# Patient Record
Sex: Male | Born: 1975 | Race: White | Hispanic: No | Marital: Married | Smoking: Current every day smoker
Health system: Southern US, Community
[De-identification: ages and names within clinical notes are randomized; demographics above are authoritative.]

## PROBLEM LIST (undated history)

## (undated) DIAGNOSIS — F909 Attention-deficit hyperactivity disorder, unspecified type: Secondary | ICD-10-CM

## (undated) DIAGNOSIS — E785 Hyperlipidemia, unspecified: Secondary | ICD-10-CM

## (undated) DIAGNOSIS — H409 Unspecified glaucoma: Secondary | ICD-10-CM

## (undated) DIAGNOSIS — R011 Cardiac murmur, unspecified: Secondary | ICD-10-CM

## (undated) HISTORY — DX: Hyperlipidemia, unspecified: E78.5

## (undated) HISTORY — PX: CYST REMOVAL HAND: SHX6279

## (undated) HISTORY — DX: Cardiac murmur, unspecified: R01.1

## (undated) HISTORY — PX: OTHER SURGICAL HISTORY: SHX169

## (undated) HISTORY — DX: Unspecified glaucoma: H40.9

## (undated) HISTORY — DX: Attention-deficit hyperactivity disorder, unspecified type: F90.9

---

## 2005-11-21 ENCOUNTER — Ambulatory Visit (HOSPITAL_COMMUNITY): Payer: Self-pay | Admitting: *Deleted

## 2005-12-12 ENCOUNTER — Ambulatory Visit (HOSPITAL_COMMUNITY): Payer: Self-pay | Admitting: Psychiatry

## 2006-01-30 ENCOUNTER — Ambulatory Visit (HOSPITAL_COMMUNITY): Payer: Self-pay | Admitting: Psychiatry

## 2006-05-20 ENCOUNTER — Ambulatory Visit (HOSPITAL_COMMUNITY): Payer: Self-pay | Admitting: Psychiatry

## 2006-08-14 ENCOUNTER — Ambulatory Visit (HOSPITAL_COMMUNITY): Payer: Self-pay | Admitting: Psychiatry

## 2007-05-13 ENCOUNTER — Ambulatory Visit (HOSPITAL_COMMUNITY): Payer: Self-pay | Admitting: Psychiatry

## 2007-06-15 ENCOUNTER — Ambulatory Visit (HOSPITAL_COMMUNITY): Payer: Self-pay | Admitting: Psychiatry

## 2007-08-16 ENCOUNTER — Ambulatory Visit (HOSPITAL_COMMUNITY): Payer: Self-pay | Admitting: Psychiatry

## 2007-10-29 ENCOUNTER — Ambulatory Visit (HOSPITAL_COMMUNITY): Payer: Self-pay | Admitting: Psychiatry

## 2008-01-28 ENCOUNTER — Ambulatory Visit (HOSPITAL_COMMUNITY): Payer: Self-pay | Admitting: Psychiatry

## 2008-04-07 ENCOUNTER — Ambulatory Visit (HOSPITAL_COMMUNITY): Payer: Self-pay | Admitting: Psychiatry

## 2008-07-14 ENCOUNTER — Ambulatory Visit (HOSPITAL_COMMUNITY): Payer: Self-pay | Admitting: Psychiatry

## 2008-10-13 ENCOUNTER — Ambulatory Visit (HOSPITAL_COMMUNITY): Payer: Self-pay | Admitting: Psychiatry

## 2009-01-12 ENCOUNTER — Ambulatory Visit (HOSPITAL_COMMUNITY): Payer: Self-pay | Admitting: Psychiatry

## 2009-04-13 ENCOUNTER — Ambulatory Visit (HOSPITAL_COMMUNITY): Payer: Self-pay | Admitting: Psychiatry

## 2009-12-07 ENCOUNTER — Ambulatory Visit (HOSPITAL_COMMUNITY): Payer: Self-pay | Admitting: Psychiatry

## 2010-05-31 ENCOUNTER — Ambulatory Visit (HOSPITAL_COMMUNITY): Payer: Self-pay | Admitting: Psychiatry

## 2010-11-14 ENCOUNTER — Encounter (INDEPENDENT_AMBULATORY_CARE_PROVIDER_SITE_OTHER): Payer: BC Managed Care – PPO | Admitting: Psychiatry

## 2010-11-14 DIAGNOSIS — F909 Attention-deficit hyperactivity disorder, unspecified type: Secondary | ICD-10-CM

## 2010-11-15 ENCOUNTER — Encounter (HOSPITAL_COMMUNITY): Payer: Self-pay | Admitting: Psychiatry

## 2011-05-16 ENCOUNTER — Encounter (INDEPENDENT_AMBULATORY_CARE_PROVIDER_SITE_OTHER): Payer: BC Managed Care – PPO | Admitting: Psychiatry

## 2011-05-16 DIAGNOSIS — F988 Other specified behavioral and emotional disorders with onset usually occurring in childhood and adolescence: Secondary | ICD-10-CM

## 2011-10-28 ENCOUNTER — Encounter (HOSPITAL_COMMUNITY): Payer: Self-pay

## 2011-10-31 ENCOUNTER — Ambulatory Visit (INDEPENDENT_AMBULATORY_CARE_PROVIDER_SITE_OTHER): Payer: BC Managed Care – PPO | Admitting: Psychiatry

## 2011-10-31 ENCOUNTER — Encounter (HOSPITAL_COMMUNITY): Payer: Self-pay | Admitting: Psychiatry

## 2011-10-31 VITALS — BP 126/82 | Ht 70.0 in | Wt 197.0 lb

## 2011-10-31 DIAGNOSIS — F909 Attention-deficit hyperactivity disorder, unspecified type: Secondary | ICD-10-CM | POA: Insufficient documentation

## 2011-10-31 MED ORDER — DEXMETHYLPHENIDATE HCL ER 20 MG PO CP24: 40.0000 mg | ORAL_CAPSULE | Freq: Every day | ORAL | Status: DC

## 2011-10-31 NOTE — Progress Notes (Signed)
   [facility] Behavioral Health Follow-up Outpatient Visit  ADEYEMI HAMAD Feb 23, 1976   Subjective: The patient is a 36 year old male who has been followed by [facility] since April of 2007. I have been treating him since August of 2008. The patient is currently diagnosed with ADHD along with a mood disorder. His only medication is Focalin XR 20 mg once in the morning. The patient presents today for a six-month followup. He is doing pretty well. One of his coworkers is retiring and he may be able to take over the position. This would involve daytime hours and the Monday through Friday job. He would no longer have to work on Saturdays, or have the abnormal scheduled that he currently has. He reports that his sweating at night is doing better. He has been pushing fluids. He still does continue to smoke. The patient states that he sometimes cannot tell if he takes his medicine. He doesn't see a lot of difference. He will occasionally missed it on weekends, because his schedule is off. There are no more issues with her neighbors. He endorses good sleep and appetite. He has no other complaints. He feels that his mood has been stable.  Filed Vitals:   10/31/11 1125  BP: 126/82    Mental Status Examination  Appearance: Casually dressed Alert: Yes Attention: good  Cooperative: Yes Eye Contact: Good Speech: Regular rate rhythm and volume Psychomotor Activity: Normal Memory/Concentration: Intact Oriented: person, place, time/date and situation Mood: Euthymic Affect: Congruent Thought Processes and Associations: Logical Fund of Knowledge: Fair Thought Content: No suicidal or homicidal thoughts Insight: Fair Judgement: Fair  Diagnosis: ADHD inattentive type, mood disorder NOS  Treatment Plan: We'll increase his Focalin XR to 40 mg daily. Patient may try to take the 2 pills approximately 6 hours apart for daytime coverage. I will see him back in 3 months. I provided him with a 30  day supply along with a 90 day supply for mail in.  Jamse Mead, MD

## 2011-11-14 ENCOUNTER — Encounter (HOSPITAL_COMMUNITY): Payer: BC Managed Care – PPO | Admitting: Psychiatry

## 2012-01-30 ENCOUNTER — Ambulatory Visit (INDEPENDENT_AMBULATORY_CARE_PROVIDER_SITE_OTHER): Payer: BC Managed Care – PPO | Admitting: Psychiatry

## 2012-01-30 ENCOUNTER — Encounter (HOSPITAL_COMMUNITY): Payer: Self-pay | Admitting: Psychiatry

## 2012-01-30 VITALS — BP 122/80 | Ht 70.0 in | Wt 190.0 lb

## 2012-01-30 DIAGNOSIS — F988 Other specified behavioral and emotional disorders with onset usually occurring in childhood and adolescence: Secondary | ICD-10-CM

## 2012-01-30 DIAGNOSIS — F39 Unspecified mood [affective] disorder: Secondary | ICD-10-CM

## 2012-01-30 DIAGNOSIS — F909 Attention-deficit hyperactivity disorder, unspecified type: Secondary | ICD-10-CM

## 2012-01-30 NOTE — Progress Notes (Signed)
   [facility] Behavioral Health Follow-up Outpatient Visit  Fernando Kelley [birth year]   Subjective: The patient is a 36 year old male who has been followed by [facility] since April of 2007. I have been treating him since August of 2008. The patient is currently diagnosed with ADHD along with a mood disorder. His only medication is Focalin XR. At his last appointment, I increased it to 40 mg daily. He was to take one 20 mg capsule in the morning, and one later in the day when the morning began to wear off. The patient presents today. He reports that he has been somewhat noncompliant. Most of the time he will take the morning dose, but forget to take the later dose. He shift hours have not changed, but he is working approximately 65 hours a week. His schedule is rather erratic. He drinks 5 hour energy drinks daily, several times a day. He endorses good sleep and appetite. He denies any mood issues. He and his wife are doing well.  Filed Vitals:   01/30/12 1122  BP: 122/80    Mental Status Examination  Appearance: Casually dressed Alert: Yes Attention: good  Cooperative: Yes Eye Contact: Good Speech: Regular rate rhythm and volume Psychomotor Activity: Normal Memory/Concentration: Intact Oriented: person, place, time/date and situation Mood: Euthymic Affect: Congruent Thought Processes and Associations: Logical Fund of Knowledge: Fair Thought Content: No suicidal or homicidal thoughts Insight: Fair Judgement: Fair  Diagnosis: ADHD inattentive type, mood disorder NOS  Treatment Plan: We'llcontinue his Focalin XR.  Patient may try to take the 2 pills approximately 6 hours apart for daytime coverage, or try two in the a.m. I will see him back in 3 months. Jamse Mead, MD

## 2012-05-14 ENCOUNTER — Encounter (HOSPITAL_COMMUNITY): Payer: Self-pay | Admitting: Psychiatry

## 2012-05-14 ENCOUNTER — Ambulatory Visit (INDEPENDENT_AMBULATORY_CARE_PROVIDER_SITE_OTHER): Payer: BC Managed Care – PPO | Admitting: Psychiatry

## 2012-05-14 VITALS — BP 138/85 | Ht 70.0 in | Wt 196.0 lb

## 2012-05-14 DIAGNOSIS — F988 Other specified behavioral and emotional disorders with onset usually occurring in childhood and adolescence: Secondary | ICD-10-CM

## 2012-05-14 DIAGNOSIS — F909 Attention-deficit hyperactivity disorder, unspecified type: Secondary | ICD-10-CM

## 2012-05-14 DIAGNOSIS — F39 Unspecified mood [affective] disorder: Secondary | ICD-10-CM

## 2012-05-14 MED ORDER — METHYLPHENIDATE HCL ER (OSM) 27 MG PO TBCR: 27.0000 mg | EXTENDED_RELEASE_TABLET | ORAL | Status: DC

## 2012-05-14 NOTE — Progress Notes (Signed)
   [facility] Behavioral Health Follow-up Outpatient Visit  Fernando Kelley 04/13/1976   Subjective: The patient is a 36 year old male who has been followed by [facility] since April of 2007. I have been treating him since August of 2008. The patient is currently diagnosed with ADHD along with a mood disorder. His only medication is Focalin XR. At his last appointment, I did not make any changes. He presents today. He has not been on his Focalin XR. The last time he tried to fill it, it was $500. He is asking for something less expensive that he could try. Work has been going well but is different. Both his supervisor and to the worker who covers his first shift job were both fired. He and 2 other employees are trying to pick up the slack. His hours has not gone up. Things are good at home. The patient endorses good sleep and appetite. He is not working out. He does continue to smoke. We have discussed appropriate healthcare measures. Both he and his wife noticed a decrease in focus and attention since being without medication.  Filed Vitals:   05/14/12 1006  BP: 138/85    Mental Status Examination  Appearance: Casually dressed Alert: Yes Attention: good  Cooperative: Yes Eye Contact: Good Speech: Regular rate rhythm and volume Psychomotor Activity: Normal Memory/Concentration: Intact Oriented: person, place, time/date and situation Mood: Euthymic Affect: Congruent Thought Processes and Associations: Logical Fund of Knowledge: Fair Thought Content: No suicidal or homicidal thoughts Insight: Fair Judgement: Fair  Diagnosis: ADHD inattentive type, mood disorder NOS  Treatment Plan: Discontinuation of Focalin XR. I will do low dose trial of Concerta. Patient update in 3 weeks. I will see him back in 2 months. Jamse Mead, MD

## 2012-07-30 ENCOUNTER — Encounter (HOSPITAL_COMMUNITY): Payer: Self-pay | Admitting: Psychiatry

## 2012-07-30 ENCOUNTER — Ambulatory Visit (INDEPENDENT_AMBULATORY_CARE_PROVIDER_SITE_OTHER): Payer: BC Managed Care – PPO | Admitting: Psychiatry

## 2012-07-30 VITALS — BP 112/72 | Ht 70.0 in | Wt 198.0 lb

## 2012-07-30 DIAGNOSIS — F39 Unspecified mood [affective] disorder: Secondary | ICD-10-CM

## 2012-07-30 DIAGNOSIS — F900 Attention-deficit hyperactivity disorder, predominantly inattentive type: Secondary | ICD-10-CM

## 2012-07-30 DIAGNOSIS — F988 Other specified behavioral and emotional disorders with onset usually occurring in childhood and adolescence: Secondary | ICD-10-CM

## 2012-07-30 MED ORDER — METHYLPHENIDATE HCL ER (OSM) 54 MG PO TBCR: 54.0000 mg | EXTENDED_RELEASE_TABLET | ORAL | Status: DC

## 2012-07-30 NOTE — Progress Notes (Signed)
   [facility] Behavioral Health Follow-up Outpatient Visit  Fernando Kelley April 13, 1976   Subjective: The patient is a 36 year old male who has been followed by [facility] since April of 2007. I have been treating him since August of 2008. The patient is currently diagnosed with ADHD along with a mood disorder. At his last appointment, I discontinued the Focalin XR and started him on Concerta secondary to cost issues. The patient reports he took for one month. He did not see any difference. This was 2 and half months ago. Work is going well. 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 There is no Writer. 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 He actually applied for the position twice in the past when this was not a requirement. He didn't not get the job at a time. The patient reports good sleep and appetite. His wife continues with her side business which is going well. The patient would like to try a higher dose of the Concerta since he felt no difference on the current dose. It's was less expensive.  Filed Vitals:   07/30/12 1042  BP: 112/72    Mental Status Examination  Appearance: Casually dressed Alert: Yes Attention: good  Cooperative: Yes Eye Contact: Good Speech: Regular rate rhythm and volume Psychomotor Activity: Normal Memory/Concentration: Intact Oriented: person, place, time/date and situation Mood: Euthymic Affect: Congruent Thought Processes and Associations: Logical Fund of Knowledge: Fair Thought Content: No suicidal or homicidal thoughts Insight: Fair Judgement: Fair  Diagnosis: ADHD inattentive type, mood disorder NOS  Treatment Plan: I will increase the Concerta 54 mg daily. I will see the patient back in 2 months. I would like to receive an update in 3 weeks. Patient may call with concerns. Jamse Mead, MD

## 2012-10-01 ENCOUNTER — Ambulatory Visit (INDEPENDENT_AMBULATORY_CARE_PROVIDER_SITE_OTHER): Payer: BC Managed Care – PPO | Admitting: Psychiatry

## 2012-10-01 ENCOUNTER — Encounter (HOSPITAL_COMMUNITY): Payer: Self-pay | Admitting: Psychiatry

## 2012-10-01 VITALS — BP 122/84 | Ht 70.0 in | Wt 196.0 lb

## 2012-10-01 DIAGNOSIS — F988 Other specified behavioral and emotional disorders with onset usually occurring in childhood and adolescence: Secondary | ICD-10-CM

## 2012-10-01 DIAGNOSIS — F900 Attention-deficit hyperactivity disorder, predominantly inattentive type: Secondary | ICD-10-CM

## 2012-10-01 DIAGNOSIS — F39 Unspecified mood [affective] disorder: Secondary | ICD-10-CM

## 2012-10-01 MED ORDER — METHYLPHENIDATE HCL ER (OSM) 54 MG PO TBCR: 54.0000 mg | EXTENDED_RELEASE_TABLET | ORAL | Status: DC

## 2012-10-01 NOTE — Progress Notes (Addendum)
   [facility] Behavioral Health Follow-up Outpatient Visit  Fernando Kelley 06/14/1976   Subjective: The patient is a 37 year old male who has been followed by [facility] since April of 2007. I have been treating him since August of 2008. The patient is currently diagnosed with ADHD along with a mood disorder. At his last appointment, I increased his Concerta 54 mg daily. The patient was not feeling any effect from a 27 mg daily. He presents today. He is doing much better. Work has been stressful. He did get a Writer, but he quit. They have 2 new managers starting next week. They have never worked for [employer], but have worked for [employer] centers. The company shut down for a day and a half secondary to weather. They are still playing catch up. The patient feels like the Concerta 54 milligrams helps. It last throughout the day. He does not feel jittery. He is able to eat and sleep. He is down 2 pounds today.  Filed Vitals:   10/01/12 1014  BP: 122/84    Mental Status Examination  Appearance: Casually dressed Alert: Yes Attention: good  Cooperative: Yes Eye Contact: Good Speech: Regular rate rhythm and volume Psychomotor Activity: Normal Memory/Concentration: Intact Oriented: person, place, time/date and situation Mood: Euthymic Affect: Congruent Thought Processes and Associations: Logical Fund of Knowledge: Fair Thought Content: No suicidal or homicidal thoughts Insight: Fair Judgement: Fair  Diagnosis: ADHD inattentive type, mood disorder NOS  Treatment Plan: I will continue the Concerta 54 mg daily. I will see the patient back in 3 months.  Patient may call with concerns. Jamse Mead, MD

## 2012-12-24 ENCOUNTER — Encounter (HOSPITAL_COMMUNITY): Payer: Self-pay | Admitting: Psychiatry

## 2012-12-24 ENCOUNTER — Ambulatory Visit (INDEPENDENT_AMBULATORY_CARE_PROVIDER_SITE_OTHER): Payer: BC Managed Care – PPO | Admitting: Psychiatry

## 2012-12-24 VITALS — BP 122/84 | Ht 70.0 in | Wt 188.0 lb

## 2012-12-24 DIAGNOSIS — F39 Unspecified mood [affective] disorder: Secondary | ICD-10-CM

## 2012-12-24 DIAGNOSIS — F988 Other specified behavioral and emotional disorders with onset usually occurring in childhood and adolescence: Secondary | ICD-10-CM

## 2012-12-24 DIAGNOSIS — F900 Attention-deficit hyperactivity disorder, predominantly inattentive type: Secondary | ICD-10-CM

## 2012-12-24 MED ORDER — METHYLPHENIDATE HCL ER (OSM) 54 MG PO TBCR: 54.0000 mg | EXTENDED_RELEASE_TABLET | ORAL | Status: DC

## 2012-12-24 NOTE — Progress Notes (Signed)
   [facility] Behavioral Health Follow-up Outpatient Visit  Fernando Kelley 06/07/1976   Subjective: The patient is a 37 year old male who has been followed by [facility] since April of 2007. I have been treating him since August of 2008. The patient is currently diagnosed with ADHD along with a mood disorder. At his last appointment, I did not make any changes. The patient presents today. He is down another 8 pounds. His 2 new managers were hired at work. The one who works second shift, which is the patient's boss, is good. They work well together. This is late and his workload a little bit. Things are not as stressful. The patient continues to isolate in the garage when he needs downtime. His wife is working most weekends the summer with her photography business. He endorses good sleep. Appetite is good, even though weight is down. He would like to lose another 10 pounds. Mood has been stable. There is been no abnormal thoughts. His only concern is the price of the Concerta. It is $200 for a 30 day supply.  Filed Vitals:   12/24/12 1011  BP: 122/84   Active Ambulatory Problems    Diagnosis Date Noted  . ADHD (attention deficit hyperactivity disorder) 10/31/2011   Resolved Ambulatory Problems    Diagnosis Date Noted  . No Resolved Ambulatory Problems   No Additional Past Medical History   Current Outpatient Prescriptions on File Prior to Visit  Medication Sig Dispense Refill  . methylphenidate (CONCERTA) 54 MG CR tablet Take 1 tablet (54 mg total) by mouth every morning.  30 tablet  0  . methylphenidate (CONCERTA) 54 MG CR tablet Take 1 tablet (54 mg total) by mouth every morning.  30 tablet  0  . methylphenidate (CONCERTA) 54 MG CR tablet Take 1 tablet (54 mg total) by mouth every morning. Fill after 10/31/12  30 tablet  0  . methylphenidate (CONCERTA) 54 MG CR tablet Take 1 tablet (54 mg total) by mouth every morning. Fill after 11/30/12  30 tablet  0   No current  facility-administered medications on file prior to visit.   Review of Systems - General ROS: positive for  - weight loss Psychological ROS: negative for - anxiety or depression Cardiovascular ROS: no chest pain or dyspnea on exertion Musculoskeletal ROS: negative for - gait disturbance or muscular weakness Neurological ROS: negative for - dizziness, headaches or seizures  Mental Status Examination  Appearance: Casually dressed Alert: Yes Attention: good  Cooperative: Yes Eye Contact: Good Speech: Regular rate rhythm and volume Psychomotor Activity: Normal Memory/Concentration: Intact Oriented: person, place, time/date and situation Mood: Euthymic Affect: Congruent Thought Processes and Associations: Logical Fund of Knowledge: Fair Thought Content: No suicidal or homicidal thoughts Insight: Fair Judgement: Fair  Diagnosis: ADHD inattentive type, mood disorder NOS  Treatment Plan: I will continue the Concerta 54 mg daily. I will see if a 90 day supply from [facility] is cheaper.  I will see the patient back in 3 months.  Patient may call with concerns. Jamse Mead, MD

## 2013-04-01 ENCOUNTER — Encounter (HOSPITAL_COMMUNITY): Payer: Self-pay | Admitting: Psychiatry

## 2013-04-01 ENCOUNTER — Ambulatory Visit (INDEPENDENT_AMBULATORY_CARE_PROVIDER_SITE_OTHER): Payer: BC Managed Care – PPO | Admitting: Psychiatry

## 2013-04-01 VITALS — BP 118/78 | Ht 70.0 in | Wt 190.0 lb

## 2013-04-01 DIAGNOSIS — F988 Other specified behavioral and emotional disorders with onset usually occurring in childhood and adolescence: Secondary | ICD-10-CM

## 2013-04-01 DIAGNOSIS — F900 Attention-deficit hyperactivity disorder, predominantly inattentive type: Secondary | ICD-10-CM

## 2013-04-01 DIAGNOSIS — F39 Unspecified mood [affective] disorder: Secondary | ICD-10-CM

## 2013-04-01 NOTE — Progress Notes (Signed)
   [facility] Behavioral Health Follow-up Outpatient Visit  Fernando Kelley September 12, 1975   Subjective: The patient is a 37 year old male who has been followed by [facility] since April of 2007. I have been treating him since August of 2008. The patient is currently diagnosed with ADHD along with a mood disorder. At his last appointment, I did not make any changes. The patient presents today. He is up 2 pounds today, but wearing steel toed shoes. The patient never filled a 90 day prescription. He had sufficient 30 day prescriptions. 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 He and his wife went to the [employer] for one week. They had a good the time. The patient endorses good sleep and appetite. He feels the Concerta is at a good dose and is happy that he only has to take it once a day. Focus and attention are good. He denies any mood symptoms.  Filed Vitals:   04/01/13 1003  BP: 118/78   Active Ambulatory Problems    Diagnosis Date Noted  . ADHD (attention deficit hyperactivity disorder) 10/31/2011   Resolved Ambulatory Problems    Diagnosis Date Noted  . No Resolved Ambulatory Problems   No Additional Past Medical History   Current Outpatient Prescriptions on File Prior to Visit  Medication Sig Dispense Refill  . methylphenidate (CONCERTA) 54 MG CR tablet Take 1 tablet (54 mg total) by mouth every morning.  30 tablet  0  . methylphenidate (CONCERTA) 54 MG CR tablet Take 1 tablet (54 mg total) by mouth every morning.  30 tablet  0  . methylphenidate (CONCERTA) 54 MG CR tablet Take 1 tablet (54 mg total) by mouth every morning. Fill after 10/31/12  30 tablet  0  . methylphenidate (CONCERTA) 54 MG CR tablet Take 1 tablet (54 mg total) by mouth every morning. Fill after 11/30/12  30 tablet  0  . methylphenidate (CONCERTA) 54 MG CR tablet Take 1 tablet (54 mg total) by mouth every morning.  90 tablet  0   No current facility-administered medications on file prior to  visit.   Review of Systems - General ROS: positive for  - weight loss Psychological ROS: negative for - anxiety or depression Cardiovascular ROS: no chest pain or dyspnea on exertion Musculoskeletal ROS: negative for - gait disturbance or muscular weakness Neurological ROS: negative for - dizziness, headaches or seizures  Mental Status Examination  Appearance: Casually dressed Alert: Yes Attention: good  Cooperative: Yes Eye Contact: Good Speech: Regular rate rhythm and volume Psychomotor Activity: Normal Memory/Concentration: Intact Oriented: person, place, time/date and situation Mood: Euthymic Affect: Congruent Thought Processes and Associations: Logical Fund of Knowledge: Fair Thought Content: No suicidal or homicidal thoughts Insight: Fair Judgement: Fair  Diagnosis: ADHD inattentive type, mood disorder NOS  Treatment Plan: I will continue the Concerta 54 mg daily. Patient will once again try to mail the prescription to [facility].  I will see the patient back in 3 months.  Patient may call with concerns. Jamse Mead, MD

## 2013-04-28 ENCOUNTER — Ambulatory Visit (INDEPENDENT_AMBULATORY_CARE_PROVIDER_SITE_OTHER): Payer: BC Managed Care – PPO | Admitting: Sports Medicine

## 2013-04-28 ENCOUNTER — Encounter: Payer: Self-pay | Admitting: Sports Medicine

## 2013-04-28 ENCOUNTER — Ambulatory Visit (INDEPENDENT_AMBULATORY_CARE_PROVIDER_SITE_OTHER): Payer: BC Managed Care – PPO

## 2013-04-28 VITALS — BP 128/84 | HR 75 | Wt 186.0 lb

## 2013-04-28 DIAGNOSIS — M542 Cervicalgia: Secondary | ICD-10-CM

## 2013-04-28 DIAGNOSIS — F17200 Nicotine dependence, unspecified, uncomplicated: Secondary | ICD-10-CM

## 2013-04-28 DIAGNOSIS — M25519 Pain in unspecified shoulder: Secondary | ICD-10-CM

## 2013-04-28 DIAGNOSIS — Z87891 Personal history of nicotine dependence: Secondary | ICD-10-CM | POA: Insufficient documentation

## 2013-04-28 DIAGNOSIS — Z299 Encounter for prophylactic measures, unspecified: Secondary | ICD-10-CM

## 2013-04-28 DIAGNOSIS — M25511 Pain in right shoulder: Secondary | ICD-10-CM | POA: Insufficient documentation

## 2013-04-28 DIAGNOSIS — Z0000 Encounter for general adult medical examination without abnormal findings: Secondary | ICD-10-CM | POA: Insufficient documentation

## 2013-04-28 MED ORDER — NAPROXEN 500 MG PO TABS: 500.0000 mg | ORAL_TABLET | Freq: Two times a day (BID) | ORAL | Status: DC

## 2013-04-28 MED ORDER — CYCLOBENZAPRINE HCL 10 MG PO TABS: ORAL_TABLET | ORAL | Status: DC

## 2013-04-28 MED ORDER — PREDNISONE 50 MG PO TABS: ORAL_TABLET | ORAL | Status: DC

## 2013-04-28 NOTE — Assessment & Plan Note (Signed)
Gets lipids done at work, will schedule a CPE.

## 2013-04-28 NOTE — Assessment & Plan Note (Signed)
Clinically symptoms seem more to represent rotator cuff dysfunction however based on his history they sound more to represent cervical radiculitis, does continue to represent the enigma of sports medicine. We will treat and evaluate both. X-rays, prednisone, naproxen, Flexeril at bedtime, formal physical therapy. Return in one month.

## 2013-04-28 NOTE — Assessment & Plan Note (Signed)
Wife smokes and is not ready to quit. He will be ready when his wife is, at that point we can pursue Chantix.

## 2013-04-28 NOTE — Progress Notes (Signed)
  Subjective:    CC: Right shoulder pain.   HPI:  This is a very pleasant 37 year old male, he comes in with a several week history of pain in his right shoulder the localized over the deltoid, radiating up towards the neck. It's worse when looking up and looking to the right. He denies any paresthesias coming down to his hand, it does not wake him up at night. He denies any trauma, symptoms are moderate, persistent. They're not worse with overhead activities, he denies any mechanical symptoms. He does have mild to moderate neck pain.  Past medical history, Surgical history, Family history not pertinant except as noted below, Social history, Allergies, and medications have been entered into the medical record, reviewed, and no changes needed.   Review of Systems: No headache, visual changes, nausea, vomiting, diarrhea, constipation, dizziness, abdominal pain, skin rash, fevers, chills, night sweats, swollen lymph nodes, weight loss, chest pain, body aches, joint swelling, muscle aches, shortness of breath, mood changes, visual or auditory hallucinations.  Objective:    General: Well Developed, well nourished, and in no acute distress.  Neuro: Alert and oriented x3, extra-ocular muscles intact, sensation grossly intact.  HEENT: Normocephalic, atraumatic, pupils equal round reactive to light, neck supple, no masses, no lymphadenopathy, thyroid nonpalpable.  Skin: Warm and dry, no rashes noted.  Cardiac: Regular rate and rhythm, no murmurs rubs or gallops.  Respiratory: Clear to auscultation bilaterally. Not using accessory muscles, speaking in full sentences.  Abdominal: Soft, nontender, nondistended, positive bowel sounds, no masses, no organomegaly.  Right Shoulder: Inspection reveals no abnormalities, atrophy or asymmetry. Palpation is normal with no tenderness over AC joint or bicipital groove. ROM is full in all planes. Rotator cuff strength normal throughout. Minimally positive Hawkins  sign, positive Neer sign, negative Empty Can sign. Speeds and Yergason's tests normal. Positive O'Brien's test, negative cross arm test, negative crank test, negative clunk test. Normal scapular function observed. No painful arc and no drop arm sign. No apprehension sign Neck: Inspection unremarkable. No palpable stepoffs. Negative Spurling's maneuver. Full neck range of motion Grip strength and sensation normal in bilateral hands Strength good C4 to T1 distribution No sensory change to C4 to T1 Negative Hoffman sign bilaterally Reflexes normal  X-rays were reviewed, there's mild degenerative change in the acromioclavicular joint, cervical spine shows straightening of the normal lordosis, otherwise negative.  Impression and Recommendations:    The patient was counselled, risk factors were discussed, anticipatory guidance given.

## 2013-05-10 ENCOUNTER — Ambulatory Visit: Payer: BC Managed Care – PPO | Admitting: Physical Therapy

## 2013-05-10 DIAGNOSIS — M25519 Pain in unspecified shoulder: Secondary | ICD-10-CM

## 2013-05-26 ENCOUNTER — Ambulatory Visit (INDEPENDENT_AMBULATORY_CARE_PROVIDER_SITE_OTHER): Payer: BC Managed Care – PPO | Admitting: Sports Medicine

## 2013-05-26 ENCOUNTER — Encounter: Payer: Self-pay | Admitting: Sports Medicine

## 2013-05-26 ENCOUNTER — Encounter (INDEPENDENT_AMBULATORY_CARE_PROVIDER_SITE_OTHER): Payer: Self-pay

## 2013-05-26 VITALS — BP 128/82 | HR 84 | Wt 188.0 lb

## 2013-05-26 DIAGNOSIS — Z299 Encounter for prophylactic measures, unspecified: Secondary | ICD-10-CM

## 2013-05-26 DIAGNOSIS — M25511 Pain in right shoulder: Secondary | ICD-10-CM

## 2013-05-26 DIAGNOSIS — M7750 Other enthesopathy of unspecified foot: Secondary | ICD-10-CM

## 2013-05-26 DIAGNOSIS — M25519 Pain in unspecified shoulder: Secondary | ICD-10-CM

## 2013-05-26 DIAGNOSIS — F17200 Nicotine dependence, unspecified, uncomplicated: Secondary | ICD-10-CM

## 2013-05-26 DIAGNOSIS — M7741 Metatarsalgia, right foot: Secondary | ICD-10-CM | POA: Insufficient documentation

## 2013-05-26 MED ORDER — VARENICLINE TARTRATE 1 MG PO TABS: 1.0000 mg | ORAL_TABLET | Freq: Two times a day (BID) | ORAL | Status: DC

## 2013-05-26 MED ORDER — VARENICLINE TARTRATE 0.5 MG X 11 & 1 MG X 42 PO MISC: ORAL | Status: DC

## 2013-05-26 NOTE — Assessment & Plan Note (Signed)
Desires to start Chantix. I will provide him with the initial starter pack as well as the continuing doses.

## 2013-05-26 NOTE — Assessment & Plan Note (Signed)
With breakdown of the transverse arch, clawing of the toes, and abnormal callous. He will return for custom orthotics with a metatarsal pad.

## 2013-05-26 NOTE — Assessment & Plan Note (Signed)
Fernando Kelley will return for complete physical and a separate visit.

## 2013-05-26 NOTE — Assessment & Plan Note (Signed)
Completely resolved with conservative measures including steroids, muscle relaxers, NSAIDs, and physical therapy.

## 2013-05-26 NOTE — Progress Notes (Signed)
  Subjective:    CC: Followup  HPI: Shoulder pain: Resolved with conservative measures.  Foot pain: Has abnormal callous, as well as pain at the metatarsal heads with prolonged weightbearing. He is localized, moderate, does not radiate.  Smoker: Desires to start Chantix.  Preventive measures will desires to return for complete physical.  Past medical history, Surgical history, Family history not pertinant except as noted below, Social history, Allergies, and medications have been entered into the medical record, reviewed, and no changes needed.   Review of Systems: No fevers, chills, night sweats, weight loss, chest pain, or shortness of breath.   Objective:    General: Well Developed, well nourished, and in no acute distress.  Neuro: Alert and oriented x3, extra-ocular muscles intact, sensation grossly intact.  HEENT: Normocephalic, atraumatic, pupils equal round reactive to light, neck supple, no masses, no lymphadenopathy, thyroid nonpalpable.  Skin: Warm and dry, no rashes. Cardiac: Regular rate and rhythm, no murmurs rubs or gallops, no lower extremity edema.  Respiratory: Clear to auscultation bilaterally. Not using accessory muscles, speaking in full sentences. Right Shoulder: Inspection reveals no abnormalities, atrophy or asymmetry. Palpation is normal with no tenderness over AC joint or bicipital groove. ROM is full in all planes. Rotator cuff strength normal throughout. No signs of impingement with negative Neer and Hawkin's tests, empty can sign. Speeds and Yergason's tests normal. No labral pathology noted with negative Obrien's, negative clunk and good stability. Normal scapular function observed. No painful arc and no drop arm sign. No apprehension sign Bilateral Feet: Foot inspection and palpation reveals breakdown of the transverse arch and a drop of MT heads, there is significant tenderness to palpation under the metatarsal heads. Abnormal callous is present  under the third metatarsal heads. Hammer toes are present but mild.  Impression and Recommendations:

## 2013-05-31 ENCOUNTER — Ambulatory Visit (INDEPENDENT_AMBULATORY_CARE_PROVIDER_SITE_OTHER): Payer: BC Managed Care – PPO | Admitting: Sports Medicine

## 2013-05-31 ENCOUNTER — Encounter: Payer: Self-pay | Admitting: Sports Medicine

## 2013-05-31 VITALS — BP 130/80 | HR 74 | Wt 191.0 lb

## 2013-05-31 DIAGNOSIS — M7741 Metatarsalgia, right foot: Secondary | ICD-10-CM

## 2013-05-31 DIAGNOSIS — M7750 Other enthesopathy of unspecified foot: Secondary | ICD-10-CM

## 2013-05-31 NOTE — Progress Notes (Signed)
    Patient was fitted for a : standard, cushioned, semi-rigid orthotic. The orthotic was heated and afterward the patient stood on the orthotic blank positioned on the orthotic stand. The patient was positioned in subtalar neutral position and 10 degrees of ankle dorsiflexion in a weight bearing stance. After completion of molding, a stable base was applied to the orthotic blank. The blank was ground to a stable position for weight bearing. Size: 11 Base: Blue EVA Additional Posting and Padding: Media metatarsal pads The patient ambulated these, and they were very comfortable.  I spent 40 minutes with this patient, greater than 50% was face-to-face time counseling regarding the below diagnosis.

## 2013-05-31 NOTE — Assessment & Plan Note (Signed)
Custom orthotics with metatarsal pads placed.

## 2013-06-17 ENCOUNTER — Encounter: Payer: Self-pay | Admitting: Sports Medicine

## 2013-07-01 ENCOUNTER — Ambulatory Visit (HOSPITAL_COMMUNITY): Payer: Self-pay | Admitting: Psychiatry

## 2013-10-07 ENCOUNTER — Encounter: Payer: Self-pay | Admitting: Sports Medicine

## 2015-01-02 ENCOUNTER — Emergency Department
Admission: EM | Admit: 2015-01-02 | Discharge: 2015-01-02 | Disposition: A | Discharge status: Home / Self Care | Payer: 59 | Source: Home / Self Care | Attending: Emergency Medicine | Admitting: Emergency Medicine

## 2015-01-02 ENCOUNTER — Emergency Department (INDEPENDENT_AMBULATORY_CARE_PROVIDER_SITE_OTHER): Payer: 59

## 2015-01-02 ENCOUNTER — Encounter: Payer: Self-pay | Admitting: *Deleted

## 2015-01-02 DIAGNOSIS — R0782 Intercostal pain: Secondary | ICD-10-CM

## 2015-01-02 DIAGNOSIS — R079 Chest pain, unspecified: Secondary | ICD-10-CM | POA: Diagnosis not present

## 2015-01-02 MED ORDER — HYDROCODONE-ACETAMINOPHEN 5-325 MG PO TABS: 2.0000 | ORAL_TABLET | ORAL | Status: DC | PRN

## 2015-01-02 MED ORDER — PREDNISONE 10 MG PO TABS: ORAL_TABLET | ORAL | Status: DC

## 2015-01-02 NOTE — ED Provider Notes (Signed)
CSN: [id number]     Arrival date & time 01/02/15  1843 History   First MD Initiated Contact with Patient 01/02/15 1919     Chief Complaint  Patient presents with  . Chest Pain   (Consider location/radiation/quality/duration/timing/severity/associated sxs/prior Treatment) Patient is a 39 y.o. male presenting with chest pain. The history is provided by the patient. No language interpreter was used.  Chest Pain Pain location:  L chest Pain quality: aching   Pain radiates to:  Does not radiate Pain radiates to the back: no   Pain severity:  Moderate Onset quality:  Gradual Duration:  16 days Timing:  Constant Progression:  Worsening Chronicity:  New Context: lifting, movement and raising an arm   Context: not breathing   Relieved by:  Nothing Worsened by:  Nothing tried Associated symptoms: no abdominal pain, no nausea and no shortness of breath     History reviewed. No pertinent past medical history. History reviewed. No pertinent past surgical history. History reviewed. No pertinent family history. History  Substance Use Topics  . Smoking status: Former Smoker    Types: Cigarettes  . Smokeless tobacco: Not on file  . Alcohol Use: Yes  Pt reports pain began after shooting a crossbow several times 16 days ago.  Pt has continued pain.  Review of Systems  Respiratory: Negative for shortness of breath.   Cardiovascular: Positive for chest pain.  Gastrointestinal: Negative for nausea and abdominal pain.  All other systems reviewed and are negative.   Allergies  Review of patient's allergies indicates no known allergies.  Home Medications   Prior to Admission medications   Medication Sig Start Date End Date Taking? Authorizing Provider  HYDROcodone-acetaminophen (NORCO/VICODIN) 5-325 MG per tablet Take 2 tablets by mouth every 4 (four) hours as needed. 01/02/15   Elson AreasLeslie K Sofia, PA-C  methylphenidate (CONCERTA) 54 MG CR tablet Take 1 tablet (54 mg total) by mouth every  morning. 07/30/12   Elaina PatteeMary P Moore, MD  naproxen (NAPROSYN) 500 MG tablet Take 1 tablet (500 mg total) by mouth 2 (two) times daily with a meal. 04/28/13   Monica Bectonhomas J Thekkekandam, MD  predniSONE (DELTASONE) 10 MG tablet 6,5,4,3,2,1 taper 01/02/15   Elson AreasLeslie K Sofia, PA-C  varenicline (CHANTIX STARTING MONTH PAK) 0.5 MG X 11 & 1 MG X 42 tablet Take one 0.5mg  tablet by mouth once daily for 3 days, then increase to one 0.5mg  tablet twice daily for 3 days, then increase to one 1mg  tablet twice daily. 05/26/13   Monica Bectonhomas J Thekkekandam, MD  varenicline (CHANTIX) 1 MG tablet Take 1 tablet (1 mg total) by mouth 2 (two) times daily. 05/26/13   Monica Bectonhomas J Thekkekandam, MD   BP 154/95 mmHg  Pulse 84  Temp(Src) 97.7 F (36.5 C) (Oral)  Resp 18  Ht 5\' 8"  (1.727 m)  Wt 208 lb (94.348 kg)  BMI 31.63 kg/m2  SpO2 97% Physical Exam  Constitutional: He is oriented to person, place, and time. He appears well-developed and well-nourished.  HENT:  Head: Normocephalic.  Eyes: EOM are normal. Pupils are equal, round, and reactive to light.  Neck: Normal range of motion.  Cardiovascular: Normal rate and normal heart sounds.   Pulmonary/Chest: Effort normal and breath sounds normal.  Abdominal: Soft. He exhibits no distension.  Musculoskeletal: Normal range of motion.  Neurological: He is alert and oriented to person, place, and time.  Psychiatric: He has a normal mood and affect.  Nursing note and vitals reviewed.   ED Course  Procedures (including  critical care time) Labs Review Labs Reviewed - No data to display  Imaging Review Dg Chest 2 View  01/02/2015   CLINICAL DATA:  Left chest pain for 2 weeks  EXAM: CHEST  2 VIEW  COMPARISON:  None.  FINDINGS: Cardiomediastinal silhouette is unremarkable. No acute infiltrate or pleural effusion. No pulmonary edema. Bony thorax is unremarkable.  IMPRESSION: No active cardiopulmonary disease.   Electronically Signed   By: Natasha Mead M.D.   On: 01/02/2015 19:18     MDM  no obvious rib fracture, however area of pain consistent with cartilage area of ribs,      1. Intercostal pain    Prednisone taper Hydrocodone Follow up with Dr. Karie Schwalbe for recheck     Elson Areas, PA-C 01/02/15 1939

## 2015-01-02 NOTE — Discharge Instructions (Signed)

## 2015-01-02 NOTE — ED Notes (Signed)
Pt c/o LT rib area pain x 12/17/14 after shooting a cross bow. Denies injury. Denies SOB.

## 2017-01-22 ENCOUNTER — Encounter: Payer: Self-pay | Admitting: Sports Medicine

## 2017-01-22 ENCOUNTER — Ambulatory Visit (INDEPENDENT_AMBULATORY_CARE_PROVIDER_SITE_OTHER): Payer: 59 | Admitting: Sports Medicine

## 2017-01-22 DIAGNOSIS — M7742 Metatarsalgia, left foot: Secondary | ICD-10-CM | POA: Diagnosis not present

## 2017-01-22 DIAGNOSIS — F17200 Nicotine dependence, unspecified, uncomplicated: Secondary | ICD-10-CM | POA: Diagnosis not present

## 2017-01-22 DIAGNOSIS — E782 Mixed hyperlipidemia: Secondary | ICD-10-CM | POA: Diagnosis not present

## 2017-01-22 DIAGNOSIS — M7741 Metatarsalgia, right foot: Secondary | ICD-10-CM

## 2017-01-22 DIAGNOSIS — Z0000 Encounter for general adult medical examination without abnormal findings: Secondary | ICD-10-CM

## 2017-01-22 DIAGNOSIS — H6122 Impacted cerumen, left ear: Secondary | ICD-10-CM

## 2017-01-22 MED ORDER — VARENICLINE TARTRATE 1 MG PO TABS: 1.0000 mg | ORAL_TABLET | Freq: Two times a day (BID) | ORAL | 3 refills | Status: DC

## 2017-01-22 MED ORDER — VARENICLINE TARTRATE 0.5 MG X 11 & 1 MG X 42 PO MISC: ORAL | 0 refills | Status: DC

## 2017-01-22 NOTE — Assessment & Plan Note (Signed)
With what appears to be a corn, referral to podiatry.

## 2017-01-22 NOTE — Progress Notes (Signed)
  Subjective:    CC: Annual physical.   HPI:  This is a pleasant 41 year old male, he is here for his physical.  Smoker: Has tried Chantix before, would like to restart.  Foot pain: Has metatarsalgia with what appears to be a corn on the plantar aspect of the foot. We have not been able to successfully treat this with custom orthotics or metatarsal pads. He is agreeable to proceed with podiatry referral.  Past medical history:  Negative.  See flowsheet/record as well for more information.  Surgical history: Negative.  See flowsheet/record as well for more information.  Family history: Negative.  See flowsheet/record as well for more information.  Social history: Negative.  See flowsheet/record as well for more information.  Allergies, and medications have been entered into the medical record, reviewed, and no changes needed.    Review of Systems: No headache, visual changes, nausea, vomiting, diarrhea, constipation, dizziness, abdominal pain, skin rash, fevers, chills, night sweats, swollen lymph nodes, weight loss, chest pain, body aches, joint swelling, muscle aches, shortness of breath, mood changes, visual or auditory hallucinations.  Objective:    General: Well Developed, well nourished, and in no acute distress.  Neuro: Alert and oriented x3, extra-ocular muscles intact, sensation grossly intact. Cranial nerves II through XII are intact, motor, sensory, and coordinative functions are all intact. HEENT: Normocephalic, atraumatic, pupils equal round reactive to light, neck supple, no masses, no lymphadenopathy, thyroid nonpalpable. Oropharynx, nasopharynx, are unremarkable. Left cerumen impaction. Skin: Warm and dry, no rashes noted.  Cardiac: Regular rate and rhythm, no murmurs rubs or gallops.  Respiratory: Clear to auscultation bilaterally. Not using accessory muscles, speaking in full sentences.  Abdominal: Soft, nontender, nondistended, positive bowel sounds, no masses, no  organomegaly.  Musculoskeletal: Shoulder, elbow, wrist, hip, knee, ankle stable, and with full range of motion.  Indication: Cerumen impaction of the ear(s) Medical necessity statement: On physical examination, cerumen impairs clinically significant portions of the external auditory canal, and tympanic membrane. Noted obstructive, copious cerumen that cannot be removed without magnification and instrumentations requiring physician skills Consent: Discussed benefits and risks of procedure and verbal consent obtained Procedure: Patient was prepped for the procedure. Utilized an otoscope to assess and take note of the ear canal, the tympanic membrane, and the presence, amount, and placement of the cerumen. Gentle water irrigation and soft plastic curette was utilized to remove cerumen.  Post procedure examination: shows cerumen was completely removed. Patient tolerated procedure well. The patient is made aware that they may experience temporary vertigo, temporary hearing loss, and temporary discomfort. If these symptom last for more than 24 hours to call the clinic or proceed to the ED.  Impression and Recommendations:    The patient was counselled, risk factors were discussed, anticipatory guidance given.  Annual physical exam Unremarkable physical. Routine blood work ordered.  Metatarsalgia of both feet With what appears to be a corn, referral to podiatry.  Smoker Restarting Chantix.

## 2017-01-22 NOTE — Assessment & Plan Note (Signed)
Restarting Chantix 

## 2017-01-22 NOTE — Assessment & Plan Note (Signed)
Unremarkable physical. Routine blood work ordered.

## 2017-01-23 LAB — COMPREHENSIVE METABOLIC PANEL
AST: 35 U/L (ref 10–40)
Albumin: 4.5 g/dL (ref 3.6–5.1)
Alkaline Phosphatase: 68 U/L (ref 40–115)
Calcium: 9 mg/dL (ref 8.6–10.3)
Chloride: 106 mmol/L (ref 98–110)
Creat: 1.09 mg/dL (ref 0.60–1.35)
Potassium: 4.4 mmol/L (ref 3.5–5.3)
Sodium: 137 mmol/L (ref 135–146)
Total Bilirubin: 0.8 mg/dL (ref 0.2–1.2)
Total Protein: 6.3 g/dL (ref 6.1–8.1)

## 2017-01-23 LAB — CBC
HCT: 41.8 % (ref 38.5–50.0)
Hemoglobin: 14.7 g/dL (ref 13.2–17.1)
MCH: 31.7 pg (ref 27.0–33.0)
MCHC: 35.2 g/dL (ref 32.0–36.0)
MCV: 90.3 fL (ref 80.0–100.0)
MPV: 9.8 fL (ref 7.5–12.5)
Platelets: 284 K/uL (ref 140–400)
RBC: 4.63 MIL/uL (ref 4.20–5.80)
RDW: 13.2 % (ref 11.0–15.0)
WBC: 5.5 10*3/uL (ref 3.8–10.8)

## 2017-01-23 LAB — LIPID PANEL W/REFLEX DIRECT LDL
Cholesterol: 207 mg/dL — ABNORMAL HIGH (ref <200)
HDL: 33 mg/dL — ABNORMAL LOW (ref >40)
LDL-Cholesterol: 156 mg/dL — ABNORMAL HIGH
Non-HDL Cholesterol (Calc): 174 mg/dL — ABNORMAL HIGH (ref <130)
Total CHOL/HDL Ratio: 6.3 Ratio — ABNORMAL HIGH (ref <5.0)
Triglycerides: 79 mg/dL (ref <150)

## 2017-01-23 LAB — COMPREHENSIVE METABOLIC PANEL WITH GFR
ALT: 45 U/L (ref 9–46)
BUN: 15 mg/dL (ref 7–25)
CO2: 21 mmol/L (ref 20–31)
Glucose, Bld: 93 mg/dL (ref 65–99)

## 2017-01-23 LAB — TSH: TSH: 0.73 mIU/L (ref 0.40–4.50)

## 2017-01-24 LAB — HEMOGLOBIN A1C
Hgb A1c MFr Bld: 5.4 % (ref <5.7)
Mean Plasma Glucose: 108 mg/dL

## 2017-01-24 LAB — HIV ANTIBODY (ROUTINE TESTING W REFLEX): HIV 1&2 Ab, 4th Generation: NONREACTIVE

## 2017-01-24 LAB — VITAMIN D 25 HYDROXY (VIT D DEFICIENCY, FRACTURES): Vit D, 25-Hydroxy: 37 ng/mL (ref 30–100)

## 2017-01-25 DIAGNOSIS — E782 Mixed hyperlipidemia: Secondary | ICD-10-CM | POA: Insufficient documentation

## 2017-01-25 NOTE — Assessment & Plan Note (Signed)
Work on a low cholesterol diet and high fiber intake EVERY DAY for 3 months and then we will recheck before considering a medication.

## 2017-01-27 ENCOUNTER — Telehealth: Payer: Self-pay | Admitting: *Deleted

## 2017-01-27 NOTE — Telephone Encounter (Signed)
Outcome  Approvedtoday  Request Reference Number: [id number]. CHANTIX PAK 0.5& 1MG  is approved through 01/27/2018. For further questions, call [phone]

## 2017-01-27 NOTE — Telephone Encounter (Signed)
Pre Authorization sent to cover my meds. [id number] - PA Case ID: [id number]

## 2017-02-16 ENCOUNTER — Encounter: Payer: Self-pay | Admitting: Podiatry

## 2017-02-16 ENCOUNTER — Ambulatory Visit (INDEPENDENT_AMBULATORY_CARE_PROVIDER_SITE_OTHER): Payer: 59 | Admitting: Podiatry

## 2017-02-16 VITALS — BP 137/85 | HR 74 | Ht 69.0 in | Wt 197.0 lb

## 2017-02-16 DIAGNOSIS — M79671 Pain in right foot: Secondary | ICD-10-CM | POA: Diagnosis not present

## 2017-02-16 DIAGNOSIS — Q828 Other specified congenital malformations of skin: Secondary | ICD-10-CM | POA: Diagnosis not present

## 2017-02-16 DIAGNOSIS — L720 Epidermal cyst: Secondary | ICD-10-CM | POA: Diagnosis not present

## 2017-02-16 NOTE — Patient Instructions (Signed)
Seen for painful porokeratotic lesion on right foot. Reviewed findings and available options. Lesion debrided. Return as needed.

## 2017-02-16 NOTE — Progress Notes (Signed)
SUBJECTIVE: 41 y.o. year old male presents complaining of a painful lesion under the ball of right foot duration of 2 years. Has had all sorts of home remedy including orthotics without lasting result. He runs for exercise.  REVIEW OF SYSTEMS: Pertinent items noted in HPI and remainder of comprehensive ROS otherwise negative.  OBJECTIVE: DERMATOLOGIC EXAMINATION: Porokeratotic lesion plantar ball right in between the first and 2nd MPJ area, symptomatic.  VASCULAR EXAMINATION OF LOWER LIMBS: All pedal pulses are palpable with normal pulsation.  Capillary Filling times within 3 seconds in all digits.  No edema or erythema noted. Temperature gradient from tibial crest to dorsum of foot is within normal bilateral.  NEUROLOGIC EXAMINATION OF THE LOWER LIMBS: All epicritic sensations grossly intact. Sharp and Dull discriminatory sensations at the plantar ball of hallux is intact bilateral.   MUSCULOSKELETAL EXAMINATION: High arched cavus type foot with no gross deformities.   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 R/O Inclusion cyst, inverted verruca.  PLAN: Reviewed findings and available treatment options, debridement, acid treatment with core excision. Lesion debrided. Return as needed.

## 2017-06-26 ENCOUNTER — Telehealth: Payer: Self-pay | Admitting: Sports Medicine

## 2017-06-26 NOTE — Telephone Encounter (Signed)
Received fax for PA on Chantix sent through cover my meds waiting on authorization. - CF

## 2017-06-29 NOTE — Telephone Encounter (Signed)
Chantix starting month pak approved for coverage until 01/27/2018. Called pharmacy to notify. Patient notified as well

## 2018-01-22 ENCOUNTER — Encounter: Payer: Self-pay | Admitting: Sports Medicine

## 2018-01-22 ENCOUNTER — Ambulatory Visit (INDEPENDENT_AMBULATORY_CARE_PROVIDER_SITE_OTHER): Payer: 59 | Admitting: Sports Medicine

## 2018-01-22 DIAGNOSIS — F17200 Nicotine dependence, unspecified, uncomplicated: Secondary | ICD-10-CM

## 2018-01-22 DIAGNOSIS — R635 Abnormal weight gain: Secondary | ICD-10-CM | POA: Diagnosis not present

## 2018-01-22 DIAGNOSIS — Z0000 Encounter for general adult medical examination without abnormal findings: Secondary | ICD-10-CM

## 2018-01-22 DIAGNOSIS — G5712 Meralgia paresthetica, left lower limb: Secondary | ICD-10-CM | POA: Insufficient documentation

## 2018-01-22 MED ORDER — VARENICLINE TARTRATE 1 MG PO TABS: 1.0000 mg | ORAL_TABLET | Freq: Two times a day (BID) | ORAL | 11 refills | Status: DC

## 2018-01-22 MED ORDER — TOPIRAMATE 50 MG PO TABS: ORAL_TABLET | ORAL | 3 refills | Status: DC

## 2018-01-22 NOTE — Progress Notes (Signed)
Subjective:    CC: Annual physical exam  HPI:  This is a very pleasant 42 year old male, he is here for his physical, has 2 complaints.  Weight gain: Has been working hard on weight loss, he has been up to 230 pounds, has cut his serving size significantly and is lost a good amount of weight.  Would like some help.  Smoker: Has done really well with Chantix, currently not smoking, he has gone back and forth a few times but Chantix has made this significantly easier.  Thigh numbness: Anterolateral aspect of the left thigh for years, on and off, worse with prolonged hip flexion and extension, driving a car.  Really has no back pain and no symptoms below the knee.  I reviewed the past medical history, family history, social history, surgical history, and allergies today and no changes were needed.  Please see the problem list section below in epic for further details.  Past Medical History: No past medical history on file. Past Surgical History: No past surgical history on file. Social History: Social History   Socioeconomic History  . Marital status: Married    Spouse name: Not on file  . Number of children: Not on file  . Years of education: Not on file  . Highest education level: Not on file  Occupational History  . Not on file  Social Needs  . Financial resource strain: Not on file  . Food insecurity:    Worry: Not on file    Inability: Not on file  . Transportation needs:    Medical: Not on file    Non-medical: Not on file  Tobacco Use  . Smoking status: Current Every Day Smoker    Types: Cigarettes  . Smokeless tobacco: Never Used  Substance and Sexual Activity  . Alcohol use: Yes  . Drug use: No  . Sexual activity: Not on file  Lifestyle  . Physical activity:    Days per week: Not on file    Minutes per session: Not on file  . Stress: Not on file  Relationships  . Social connections:    Talks on phone: Not on file    Gets together: Not on file    Attends  religious service: Not on file    Active member of club or organization: Not on file    Attends meetings of clubs or organizations: Not on file    Relationship status: Not on file  Other Topics Concern  . Not on file  Social History Narrative  . Not on file   Family History: No family history on file. Allergies: No Known Allergies Medications: See med rec.  Review of Systems: No headache, visual changes, nausea, vomiting, diarrhea, constipation, dizziness, abdominal pain, skin rash, fevers, chills, night sweats, swollen lymph nodes, weight loss, chest pain, body aches, joint swelling, muscle aches, shortness of breath, mood changes, visual or auditory hallucinations.  Objective:    General: Well Developed, well nourished, and in no acute distress.  Neuro: Alert and oriented x3, extra-ocular muscles intact, sensation grossly intact. Cranial nerves II through XII are intact, motor, sensory, and coordinative functions are all intact. HEENT: Normocephalic, atraumatic, pupils equal round reactive to light, neck supple, no masses, no lymphadenopathy, thyroid nonpalpable. Oropharynx, nasopharynx, external ear canals are unremarkable. Skin: Warm and dry, no rashes noted.  Cardiac: Regular rate and rhythm, no murmurs rubs or gallops.  Respiratory: Clear to auscultation bilaterally. Not using accessory muscles, speaking in full sentences.  Abdominal: Soft, nontender, nondistended, positive  bowel sounds, no masses, no organomegaly.  Musculoskeletal: Shoulder, elbow, wrist, hip, knee, ankle stable, and with full range of motion.  Impression and Recommendations:    The patient was counselled, risk factors were discussed, anticipatory guidance given.  Annual physical exam Routine physical as above. Checking routine labs.  Smoker Has done well with smoking cessation on Chantix, refilling this.  Abnormal weight gain Starting very conservative, adding topiramate and an up taper. Return in 1  month to recheck weight.  Meralgia paresthetica of left side We will work on weight loss, he will wear some loose fitting clothing, no belts. Return in 1 month, if no improvement in symptoms we will do an injection medial to the ASIS. ___________________________________________ Ihor Austin. Benjamin Stain, M.D., ABFM., CAQSM. Primary Care and Sports Medicine [facility]  Adjunct Instructor of Family Medicine  [employer]

## 2018-01-22 NOTE — Assessment & Plan Note (Signed)
Starting very conservative, adding topiramate and an up taper. Return in 1 month to recheck weight.

## 2018-01-22 NOTE — Patient Instructions (Signed)
Lateral Femoral Cutaneous Nerve Block Patient Information  Description: The lateral femoral cutaneous nerve of the thigh is a purely sensory nerve that can become entrapped or irritated for a number of reasons.  The pain associated with this condition is called meralgia paraesthetica.  Patients affected with this syndrome have burning pain or abnormal sensation along the lateral aspect of the thigh.  The pain can be worsened by prolonged walking, standing, or constrictive garments around the house.   The diagnosis can be confirmed and treatment initiated by blocking the nerve with local anesthetic (like Novocaine).  At times, a steroid solution may be injected at the same time.  The site of injection is through a tiny needle in the left, lower quadrant of the abdomen.   The entire block usually lasts less than 5 minutes.  Conditions which may be treated by lateral femoral cutaneous nerve block:   Meralagia paraesthetica  Preparation for the injection:  1. Do not eat any solid food or dairy products within 8 hours of your appointment.  2. You may drink clear liquids up to 3 hours before appointment.  Clear liquids include water, black coffee, juice or soda. No milk or cream please. 3. You may take your regular medication, including pain medications, with a sip of water before your appointment.  Diabetics should hold regular insulin (if taken separately) and take 1/2 normal NPH dose the morning of the procedure.  Carry some sugar containing items with you to your appointment. 4. A driver must accompany you and be prepared to drive you home after your procedure. 5. Bring all you current medications with you 6. An IV may be inserted and sedation may be given at the discretion of the physician. 7. A blood pressure cuff, EKG and other monitors will often be applied during the procedure.  Some patients may need to have extra oxygen administered for a short period. 8. You will be asked to provide medical  information, including your allergies and medications, prior to the procedure.  We must know immediately if you are taking blood thinners (like Coumadin/Warfarin) or if you allergic to IV iodine contrast (dye)  We must know if you could possible be pregnant.   Possible side-effects:   Bleeding from needle site  Infection (rate, may require surgery)  Nerve injury (rare)  Numbness and Tingling (temporary)  Light-headedness (temporary)  Pain at injection site (several day)  Decreased blood pressure (rare, temporary)  Weakness in leg (temporary)  Call if you experience:  Hives or difficulty breathing (go to the emergency room)  Inflammation or drainage at the injection site(s)  Please note:  Although the local anesthetic injected can often make your leg feel good for several hours after the injection,  The pain may return.  It takes 3-7 days for steroids to work.  You may not notice any pain relief for at least one week.  If effective, we will often do a series of injections spaced 3-6 weeks apart to maximally decrease your pain.  If you have any questions, please cll [phone] [facility]   

## 2018-01-22 NOTE — Assessment & Plan Note (Signed)
Has done well with smoking cessation on Chantix, refilling this.

## 2018-01-22 NOTE — Assessment & Plan Note (Signed)
Routine physical as above. Checking routine labs.  

## 2018-01-22 NOTE — Assessment & Plan Note (Signed)
We will work on weight loss, he will wear some loose fitting clothing, no belts. Return in 1 month, if no improvement in symptoms we will do an injection medial to the ASIS.

## 2018-01-23 LAB — COMPREHENSIVE METABOLIC PANEL WITH GFR
AG Ratio: 1.9 (calc) (ref 1.0–2.5)
AST: 29 U/L (ref 10–40)
Albumin: 4.5 g/dL (ref 3.6–5.1)
Chloride: 105 mmol/L (ref 98–110)
Globulin: 2.4 g/dL (ref 1.9–3.7)
Sodium: 139 mmol/L (ref 135–146)

## 2018-01-23 LAB — CBC
HCT: 41.5 % (ref 38.5–50.0)
Hemoglobin: 14.5 g/dL (ref 13.2–17.1)
MCH: 31.2 pg (ref 27.0–33.0)
MCHC: 34.9 g/dL (ref 32.0–36.0)
MCV: 89.2 fL (ref 80.0–100.0)
MPV: 9.8 fL (ref 7.5–12.5)
Platelets: 304 10*3/uL (ref 140–400)
RBC: 4.65 Million/uL (ref 4.20–5.80)
RDW: 12.6 % (ref 11.0–15.0)
WBC: 6.9 10*3/uL (ref 3.8–10.8)

## 2018-01-23 LAB — HEMOGLOBIN A1C
Hgb A1c MFr Bld: 5.5 % of total Hgb (ref <5.7)
Mean Plasma Glucose: 111 (calc)
eAG (mmol/L): 6.2 (calc)

## 2018-01-23 LAB — LIPID PANEL W/REFLEX DIRECT LDL
Cholesterol: 219 mg/dL — ABNORMAL HIGH (ref <200)
HDL: 36 mg/dL — ABNORMAL LOW (ref >40)
LDL Cholesterol (Calc): 159 mg/dL (calc) — ABNORMAL HIGH
Non-HDL Cholesterol (Calc): 183 mg/dL — ABNORMAL HIGH (ref <130)
Total CHOL/HDL Ratio: 6.1 (calc) — ABNORMAL HIGH (ref <5.0)
Triglycerides: 121 mg/dL (ref <150)

## 2018-01-23 LAB — COMPREHENSIVE METABOLIC PANEL
ALT: 62 U/L — ABNORMAL HIGH (ref 9–46)
Alkaline phosphatase (APISO): 71 U/L (ref 40–115)
BUN: 12 mg/dL (ref 7–25)
CO2: 26 mmol/L (ref 20–32)
Calcium: 9.6 mg/dL (ref 8.6–10.3)
Creat: 1.12 mg/dL (ref 0.60–1.35)
Glucose, Bld: 97 mg/dL (ref 65–99)
Potassium: 5.3 mmol/L (ref 3.5–5.3)
Total Bilirubin: 0.5 mg/dL (ref 0.2–1.2)
Total Protein: 6.9 g/dL (ref 6.1–8.1)

## 2018-01-23 LAB — VITAMIN D 25 HYDROXY (VIT D DEFICIENCY, FRACTURES): Vit D, 25-Hydroxy: 33 ng/mL (ref 30–100)

## 2018-01-23 LAB — TSH: TSH: 1.58 mIU/L (ref 0.40–4.50)

## 2018-03-01 ENCOUNTER — Encounter: Payer: Self-pay | Admitting: Sports Medicine

## 2018-03-01 ENCOUNTER — Ambulatory Visit: Payer: 59 | Admitting: Sports Medicine

## 2018-03-01 DIAGNOSIS — R635 Abnormal weight gain: Secondary | ICD-10-CM

## 2018-03-01 MED ORDER — TOPIRAMATE 50 MG PO TABS: 50.0000 mg | ORAL_TABLET | Freq: Two times a day (BID) | ORAL | 3 refills | Status: DC

## 2018-03-01 NOTE — Assessment & Plan Note (Signed)
Self discontinued Topamax, he is now 1 pound up from where we saw him last. He is to get back on the bandwagon, restarting Topamax at the full dose. Return in 1 month, we will discuss Saxenda if insufficient weight loss.

## 2018-03-01 NOTE — Progress Notes (Signed)
  Subjective:    CC: Weight check  HPI: Fernando Kelley is a pleasant 42 year old male, we have been helping him with weight loss, we used Topamax, he discontinued his Topamax about 2 weeks ago, he had a loss of a family member.  More recently he is interested in getting back on board and on track.  I reviewed the past medical history, family history, social history, surgical history, and allergies today and no changes were needed.  Please see the problem list section below in epic for further details.  Past Medical History: No past medical history on file. Past Surgical History: No past surgical history on file. Social History: Social History   Socioeconomic History  . Marital status: Married    Spouse name: Not on file  . Number of children: Not on file  . Years of education: Not on file  . Highest education level: Not on file  Occupational History  . Not on file  Social Needs  . Financial resource strain: Not on file  . Food insecurity:    Worry: Not on file    Inability: Not on file  . Transportation needs:    Medical: Not on file    Non-medical: Not on file  Tobacco Use  . Smoking status: Current Every Day Smoker    Types: Cigarettes  . Smokeless tobacco: Never Used  Substance and Sexual Activity  . Alcohol use: Yes  . Drug use: No  . Sexual activity: Not on file  Lifestyle  . Physical activity:    Days per week: Not on file    Minutes per session: Not on file  . Stress: Not on file  Relationships  . Social connections:    Talks on phone: Not on file    Gets together: Not on file    Attends religious service: Not on file    Active member of club or organization: Not on file    Attends meetings of clubs or organizations: Not on file    Relationship status: Not on file  Other Topics Concern  . Not on file  Social History Narrative  . Not on file   Family History: No family history on file. Allergies: No Known Allergies Medications: See med rec.  Review of  Systems: No fevers, chills, night sweats, weight loss, chest pain, or shortness of breath.   Objective:    General: Well Developed, well nourished, and in no acute distress.  Neuro: Alert and oriented x3, extra-ocular muscles intact, sensation grossly intact.  HEENT: Normocephalic, atraumatic, pupils equal round reactive to light, neck supple, no masses, no lymphadenopathy, thyroid nonpalpable.  Skin: Warm and dry, no rashes. Cardiac: Regular rate and rhythm, no murmurs rubs or gallops, no lower extremity edema.  Respiratory: Clear to auscultation bilaterally. Not using accessory muscles, speaking in full sentences.  Impression and Recommendations:    Abnormal weight gain Self discontinued Topamax, he is now 1 pound up from where we saw him last. He is to get back on the bandwagon, restarting Topamax at the full dose. Return in 1 month, we will discuss Saxenda if insufficient weight loss.  I spent 25 minutes with this patient, greater than 50% was face-to-face time counseling regarding the above diagnoses ___________________________________________ Ihor Austinhomas J. Benjamin Stainhekkekandam, M.D., ABFM., CAQSM. Primary Care and Sports Medicine [facility]  Adjunct Instructor of Family Medicine  [employer]

## 2018-03-11 ENCOUNTER — Telehealth: Payer: Self-pay | Admitting: Sports Medicine

## 2018-03-11 NOTE — Telephone Encounter (Signed)
Received from form [employer] that Chantix was approved through 03/11/2019.Pharmacy notified and form sent to scan.

## 2018-04-01 ENCOUNTER — Ambulatory Visit: Payer: 59 | Admitting: Sports Medicine

## 2018-04-01 ENCOUNTER — Encounter: Payer: Self-pay | Admitting: Sports Medicine

## 2018-04-01 DIAGNOSIS — F17200 Nicotine dependence, unspecified, uncomplicated: Secondary | ICD-10-CM

## 2018-04-01 DIAGNOSIS — G5712 Meralgia paresthetica, left lower limb: Secondary | ICD-10-CM | POA: Diagnosis not present

## 2018-04-01 DIAGNOSIS — R635 Abnormal weight gain: Secondary | ICD-10-CM

## 2018-04-01 DIAGNOSIS — E782 Mixed hyperlipidemia: Secondary | ICD-10-CM

## 2018-04-01 NOTE — Progress Notes (Signed)
Subjective:    CC: Weight check  HPI: Fernando Kelley returns, he is done fantastic over the past month, he is on Topamax twice a day 50 mg, has also been dieting, exercising.  He lost 14 pounds, feels significantly better.  Meralgia paresthetica: Unfortunately weight loss and wearing loose fitting clothing is not helped the numbness on his anterolateral left thigh.  No back pain.  I reviewed the past medical history, family history, social history, surgical history, and allergies today and no changes were needed.  Please see the problem list section below in epic for further details.  Past Medical History: No past medical history on file. Past Surgical History: No past surgical history on file. Social History: Social History   Socioeconomic History  . Marital status: Married    Spouse name: Not on file  . Number of children: Not on file  . Years of education: Not on file  . Highest education level: Not on file  Occupational History  . Not on file  Social Needs  . Financial resource strain: Not on file  . Food insecurity:    Worry: Not on file    Inability: Not on file  . Transportation needs:    Medical: Not on file    Non-medical: Not on file  Tobacco Use  . Smoking status: Current Every Day Smoker    Types: Cigarettes  . Smokeless tobacco: Never Used  Substance and Sexual Activity  . Alcohol use: Yes  . Drug use: No  . Sexual activity: Not on file  Lifestyle  . Physical activity:    Days per week: Not on file    Minutes per session: Not on file  . Stress: Not on file  Relationships  . Social connections:    Talks on phone: Not on file    Gets together: Not on file    Attends religious service: Not on file    Active member of club or organization: Not on file    Attends meetings of clubs or organizations: Not on file    Relationship status: Not on file  Other Topics Concern  . Not on file  Social History Narrative  . Not on file   Family History: No family  history on file. Allergies: No Known Allergies Medications: See med rec.  Review of Systems: No fevers, chills, night sweats, weight loss, chest pain, or shortness of breath.   Objective:    General: Well Developed, well nourished, and in no acute distress.  Neuro: Alert and oriented x3, extra-ocular muscles intact, sensation grossly intact.  HEENT: Normocephalic, atraumatic, pupils equal round reactive to light, neck supple, no masses, no lymphadenopathy, thyroid nonpalpable.  Skin: Warm and dry, no rashes. Cardiac: Regular rate and rhythm, no murmurs rubs or gallops, no lower extremity edema.  Respiratory: Clear to auscultation bilaterally. Not using accessory muscles, speaking in full sentences.  Impression and Recommendations:    Abnormal weight gain 14 pound weight loss after the first month on Topamax. He is going to return in 3 months.  Meralgia paresthetica of left side Persistent paresthesias on the right lateral thigh consistent with meralgia paresthetica. 16 pound weight loss has not helped, he is also wearing looser fitting closed which has also not helped. We will continue 3 more months of weight loss with Topamax before considering nerve conduction study and then lateral femoral cutaneous nerve hydrodissection under ultrasound guidance.  Smoker Doing well with Chantix, he will call me for refills.  Hyperlipidemia, mixed 14 pound weight loss  after the first month on Topamax, we can recheck his lipids in 3 months.  I spent 25 minutes with this patient, greater than 50% was face-to-face time counseling regarding the above diagnoses ___________________________________________ Ihor Austinhomas J. Benjamin Stainhekkekandam, M.D., ABFM., CAQSM. Primary Care and Sports Medicine [place]  Adjunct Instructor of Family Medicine  [employer]

## 2018-04-01 NOTE — Assessment & Plan Note (Signed)
Persistent paresthesias on the right lateral thigh consistent with meralgia paresthetica. 16 pound weight loss has not helped, he is also wearing looser fitting closed which has also not helped. We will continue 3 more months of weight loss with Topamax before considering nerve conduction study and then lateral femoral cutaneous nerve hydrodissection under ultrasound guidance.

## 2018-04-01 NOTE — Assessment & Plan Note (Addendum)
14 pound weight loss after the first month on Topamax. He is going to return in 3 months.

## 2018-04-01 NOTE — Assessment & Plan Note (Signed)
Doing well with Chantix, he will call me for refills.

## 2018-04-01 NOTE — Assessment & Plan Note (Signed)
14 pound weight loss after the first month on Topamax, we can recheck his lipids in 3 months.

## 2018-07-02 ENCOUNTER — Encounter: Payer: Self-pay | Admitting: Sports Medicine

## 2018-07-02 ENCOUNTER — Ambulatory Visit: Payer: 59 | Admitting: Sports Medicine

## 2018-07-02 DIAGNOSIS — M25522 Pain in left elbow: Secondary | ICD-10-CM | POA: Insufficient documentation

## 2018-07-02 DIAGNOSIS — E782 Mixed hyperlipidemia: Secondary | ICD-10-CM | POA: Diagnosis not present

## 2018-07-02 DIAGNOSIS — M7701 Medial epicondylitis, right elbow: Secondary | ICD-10-CM

## 2018-07-02 DIAGNOSIS — M222X1 Patellofemoral disorders, right knee: Secondary | ICD-10-CM | POA: Diagnosis not present

## 2018-07-02 DIAGNOSIS — M25521 Pain in right elbow: Secondary | ICD-10-CM | POA: Insufficient documentation

## 2018-07-02 DIAGNOSIS — M1710 Unilateral primary osteoarthritis, unspecified knee: Secondary | ICD-10-CM | POA: Insufficient documentation

## 2018-07-02 MED ORDER — MELOXICAM 15 MG PO TABS: ORAL_TABLET | ORAL | 3 refills | Status: DC

## 2018-07-02 NOTE — Progress Notes (Signed)
Subjective:    CC: Follow-up  HPI: This is a pleasant 42 year old male, he has continued to lose weight, very happy with results so far.  He has unfortunately developed pain in his right elbow, medial aspect, worse with gripping motions, localized without radiation.  Also has right knee pain, localized medially, as well as under the patella.  No mechanical symptoms, no trauma.  I reviewed the past medical history, family history, social history, surgical history, and allergies today and no changes were needed.  Please see the problem list section below in epic for further details.  Past Medical History: No past medical history on file. Past Surgical History: No past surgical history on file. Social History: Social History   Socioeconomic History  . Marital status: Married    Spouse name: Not on file  . Number of children: Not on file  . Years of education: Not on file  . Highest education level: Not on file  Occupational History  . Not on file  Social Needs  . Financial resource strain: Not on file  . Food insecurity:    Worry: Not on file    Inability: Not on file  . Transportation needs:    Medical: Not on file    Non-medical: Not on file  Tobacco Use  . Smoking status: Current Every Day Smoker    Types: Cigarettes  . Smokeless tobacco: Never Used  Substance and Sexual Activity  . Alcohol use: Yes  . Drug use: No  . Sexual activity: Not on file  Lifestyle  . Physical activity:    Days per week: Not on file    Minutes per session: Not on file  . Stress: Not on file  Relationships  . Social connections:    Talks on phone: Not on file    Gets together: Not on file    Attends religious service: Not on file    Active member of club or organization: Not on file    Attends meetings of clubs or organizations: Not on file    Relationship status: Not on file  Other Topics Concern  . Not on file  Social History Narrative  . Not on file   Family History: No  family history on file. Allergies: No Known Allergies Medications: See med rec.  Review of Systems: No fevers, chills, night sweats, weight loss, chest pain, or shortness of breath.   Objective:    General: Well Developed, well nourished, and in no acute distress.  Neuro: Alert and oriented x3, extra-ocular muscles intact, sensation grossly intact.  HEENT: Normocephalic, atraumatic, pupils equal round reactive to light, neck supple, no masses, no lymphadenopathy, thyroid nonpalpable.  Skin: Warm and dry, no rashes. Cardiac: Regular rate and rhythm, no murmurs rubs or gallops, no lower extremity edema.  Respiratory: Clear to auscultation bilaterally. Not using accessory muscles, speaking in full sentences. Right elbow: Unremarkable to inspection. Range of motion full pronation, supination, flexion, extension. Strength is full to all of the above directions Stable to varus, valgus stress. Negative moving valgus stress test. Tender to palpation at the common flexor tendon origin Ulnar nerve does not sublux. Negative cubital tunnel Tinel's. Right knee: Normal to inspection with no erythema or effusion or obvious bony abnormalities. Tenderness in the patellar facets ROM normal in flexion and extension and lower leg rotation. Ligaments with solid consistent endpoints including ACL, PCL, LCL, MCL. Negative Mcmurray's and provocative meniscal tests. Non painful patellar compression. Patellar and quadriceps tendons unremarkable. Hamstring and quadriceps strength is normal.  Impression and Recommendations:    Hyperlipidemia, mixed Good continued weight loss, rechecking lipids.  Medial epicondylitis, right Meloxicam, home rehab exercises given. X-rays.  Patellofemoral syndrome, right Rehab exercises given, x-rays. Return in 2 to 4 weeks, injection if no better.  ___________________________________________ Ihor Austinhomas J. Benjamin Stainhekkekandam, M.D., ABFM., CAQSM. Primary Care and Sports  Medicine [facility]  Adjunct Professor of Family Medicine  [employer]

## 2018-07-02 NOTE — Assessment & Plan Note (Signed)
Rehab exercises given, x-rays. Return in 2 to 4 weeks, injection if no better.

## 2018-07-02 NOTE — Assessment & Plan Note (Signed)
Good continued weight loss, rechecking lipids.

## 2018-07-02 NOTE — Assessment & Plan Note (Signed)
Meloxicam, home rehab exercises given. X-rays.

## 2018-08-16 ENCOUNTER — Ambulatory Visit: Payer: Self-pay | Admitting: Sports Medicine

## 2019-08-17 ENCOUNTER — Ambulatory Visit (INDEPENDENT_AMBULATORY_CARE_PROVIDER_SITE_OTHER): Payer: 59 | Admitting: Sports Medicine

## 2019-08-17 ENCOUNTER — Encounter: Payer: Self-pay | Admitting: Sports Medicine

## 2019-08-17 ENCOUNTER — Other Ambulatory Visit: Payer: Self-pay

## 2019-08-17 ENCOUNTER — Ambulatory Visit (INDEPENDENT_AMBULATORY_CARE_PROVIDER_SITE_OTHER): Payer: 59

## 2019-08-17 DIAGNOSIS — H6122 Impacted cerumen, left ear: Secondary | ICD-10-CM | POA: Diagnosis not present

## 2019-08-17 DIAGNOSIS — M25522 Pain in left elbow: Secondary | ICD-10-CM | POA: Diagnosis not present

## 2019-08-17 DIAGNOSIS — F17200 Nicotine dependence, unspecified, uncomplicated: Secondary | ICD-10-CM | POA: Diagnosis not present

## 2019-08-17 DIAGNOSIS — M25521 Pain in right elbow: Secondary | ICD-10-CM

## 2019-08-17 DIAGNOSIS — Z0000 Encounter for general adult medical examination without abnormal findings: Secondary | ICD-10-CM

## 2019-08-17 DIAGNOSIS — Z87891 Personal history of nicotine dependence: Secondary | ICD-10-CM

## 2019-08-17 MED ORDER — VARENICLINE TARTRATE 1 MG PO TABS: 1.0000 mg | ORAL_TABLET | Freq: Two times a day (BID) | ORAL | 11 refills | Status: DC

## 2019-08-17 MED ORDER — CELECOXIB 200 MG PO CAPS
ORAL_CAPSULE | ORAL | 2 refills | Status: DC
Start: 2019-08-17 — End: 2020-04-24

## 2019-08-17 NOTE — Assessment & Plan Note (Signed)
With gelling, question osteoarthritis. Bilateral x-rays, Celebrex, return to see me in 4 weeks, injection if no better.

## 2019-08-17 NOTE — Assessment & Plan Note (Signed)
Continues to do well on Chantix, refilling.

## 2019-08-17 NOTE — Assessment & Plan Note (Signed)
Annual physical as above. Ordering routine labs.  

## 2019-08-17 NOTE — Progress Notes (Signed)
Subjective:    CC: Annual Physical Exam  HPI:  This patient is here for their annual physical.  I reviewed the past medical history, family history, social history, surgical history, and allergies today and no changes were needed.  Please see the problem list section below in epic for further details.  Past Medical History: No past medical history on file. Past Surgical History: No past surgical history on file. Social History: Social History   Socioeconomic History  . Marital status: Married    Spouse name: Not on file  . Number of children: Not on file  . Years of education: Not on file  . Highest education level: Not on file  Occupational History  . Not on file  Tobacco Use  . Smoking status: Current Every Day Smoker    Types: Cigarettes  . Smokeless tobacco: Never Used  Substance and Sexual Activity  . Alcohol use: Yes  . Drug use: No  . Sexual activity: Not on file  Other Topics Concern  . Not on file  Social History Narrative  . Not on file   Social Determinants of Health   Financial Resource Strain:   . Difficulty of Paying Living Expenses: Not on file  Food Insecurity:   . Worried About Charity fundraiser in the Last Year: Not on file  . Ran Out of Food in the Last Year: Not on file  Transportation Needs:   . Lack of Transportation (Medical): Not on file  . Lack of Transportation (Non-Medical): Not on file  Physical Activity:   . Days of Exercise per Week: Not on file  . Minutes of Exercise per Session: Not on file  Stress:   . Feeling of Stress : Not on file  Social Connections:   . Frequency of Communication with Friends and Family: Not on file  . Frequency of Social Gatherings with Friends and Family: Not on file  . Attends Religious Services: Not on file  . Active Member of Clubs or Organizations: Not on file  . Attends Archivist Meetings: Not on file  . Marital Status: Not on file   Family History: No family history on  file. Allergies: No Known Allergies Medications: See med rec.  Review of Systems: No headache, visual changes, nausea, vomiting, diarrhea, constipation, dizziness, abdominal pain, skin rash, fevers, chills, night sweats, swollen lymph nodes, weight loss, chest pain, body aches, joint swelling, muscle aches, shortness of breath, mood changes, visual or auditory hallucinations.  Objective:    General: Well Developed, well nourished, and in no acute distress.  Neuro: Alert and oriented x3, extra-ocular muscles intact, sensation grossly intact. Cranial nerves II through XII are intact, motor, sensory, and coordinative functions are all intact. HEENT: Normocephalic, atraumatic, pupils equal round reactive to light, neck supple, no masses, no lymphadenopathy, thyroid nonpalpable. Oropharynx, nasopharynx unremarkable, left-sided cerumen impaction Skin: Warm and dry, no rashes noted.  Cardiac: Regular rate and rhythm, no murmurs rubs or gallops.  Respiratory: Clear to auscultation bilaterally. Not using accessory muscles, speaking in full sentences.  Abdominal: Soft, nontender, nondistended, positive bowel sounds, no masses, no organomegaly.  Musculoskeletal: Shoulder, elbow, wrist, hip, knee, ankle stable, and with full range of motion.  Indication: Cerumen impaction of the left and right ear(s) Medical necessity statement: On physical examination, cerumen impairs clinically significant portions of the external auditory canal, and tympanic membrane. Noted obstructive, copious cerumen that cannot be removed without magnification and instrumentations requiring physician skills Consent: Discussed benefits and risks of procedure  and verbal consent obtained Procedure: Patient was prepped for the procedure. Utilized an otoscope to assess and take note of the ear canal, the tympanic membrane, and the presence, amount, and placement of the cerumen. Gentle water irrigation and soft plastic curette was utilized  to remove cerumen.  Post procedure examination: shows cerumen was completely removed. Patient tolerated procedure well. The patient is made aware that they may experience temporary vertigo, temporary hearing loss, and temporary discomfort. If these symptom last for more than 24 hours to call the clinic or proceed to the ED.  Impression and Recommendations:    The patient was counselled, risk factors were discussed, anticipatory guidance given.  Annual physical exam Annual physical as above. Ordering routine labs.   Bilateral elbow joint pain With gelling, question osteoarthritis. Bilateral x-rays, Celebrex, return to see me in 4 weeks, injection if no better.  Former smoker Continues to do well on Chantix, refilling.   ___________________________________________ Ihor Austin. Benjamin Stain, M.D., ABFM., CAQSM. Primary Care and Sports Medicine [facility] [facility]  Adjunct Professor of Family Medicine  [employer]

## 2019-08-18 LAB — COMPLETE METABOLIC PANEL WITH GFR
AG Ratio: 2.3 (calc) (ref 1.0–2.5)
ALT: 44 U/L (ref 9–46)
AST: 22 U/L (ref 10–40)
Albumin: 4.5 g/dL (ref 3.6–5.1)
Alkaline phosphatase (APISO): 54 U/L (ref 36–130)
BUN: 16 mg/dL (ref 7–25)
CO2: 25 mmol/L (ref 20–32)
Calcium: 9.5 mg/dL (ref 8.6–10.3)
Chloride: 104 mmol/L (ref 98–110)
Creat: 1.08 mg/dL (ref 0.60–1.35)
GFR, Est African American: 98 mL/min/{1.73_m2} (ref >=60)
GFR, Est Non African American: 84 mL/min/{1.73_m2} (ref >=60)
Globulin: 2 g/dL (calc) (ref 1.9–3.7)
Glucose, Bld: 96 mg/dL (ref 65–99)
Potassium: 4.8 mmol/L (ref 3.5–5.3)
Sodium: 139 mmol/L (ref 135–146)
Total Bilirubin: 0.5 mg/dL (ref 0.2–1.2)
Total Protein: 6.5 g/dL (ref 6.1–8.1)

## 2019-08-18 LAB — LIPID PANEL W/REFLEX DIRECT LDL
Cholesterol: 217 mg/dL — ABNORMAL HIGH (ref <200)
HDL: 43 mg/dL (ref >=40)
LDL Cholesterol (Calc): 154 mg/dL (calc) — ABNORMAL HIGH
Non-HDL Cholesterol (Calc): 174 mg/dL (calc) — ABNORMAL HIGH (ref <130)
Total CHOL/HDL Ratio: 5 (calc) — ABNORMAL HIGH (ref <5.0)
Triglycerides: 95 mg/dL (ref <150)

## 2019-08-18 LAB — CBC
HCT: 42.1 % (ref 38.5–50.0)
Hemoglobin: 14.7 g/dL (ref 13.2–17.1)
MCH: 32.2 pg (ref 27.0–33.0)
MCHC: 34.9 g/dL (ref 32.0–36.0)
MCV: 92.1 fL (ref 80.0–100.0)
MPV: 9.8 fL (ref 7.5–12.5)
Platelets: 278 10*3/uL (ref 140–400)
RBC: 4.57 10*6/uL (ref 4.20–5.80)
RDW: 12.2 % (ref 11.0–15.0)
WBC: 5.6 10*3/uL (ref 3.8–10.8)

## 2019-08-18 LAB — VITAMIN D 25 HYDROXY (VIT D DEFICIENCY, FRACTURES): Vit D, 25-Hydroxy: 16 ng/mL — ABNORMAL LOW (ref 30–100)

## 2019-08-18 LAB — TSH: TSH: 1.17 mIU/L (ref 0.40–4.50)

## 2019-08-18 MED ORDER — VITAMIN D (ERGOCALCIFEROL) 1.25 MG (50000 UNIT) PO CAPS: 50000.0000 [IU] | ORAL_CAPSULE | ORAL | 0 refills | Status: DC

## 2019-08-18 NOTE — Addendum Note (Signed)
Addended by: Monica Becton on: 08/18/2019 10:28 AM   Modules accepted: Orders

## 2019-08-24 NOTE — Telephone Encounter (Signed)
Received fax from [employer] that Chantix requires a PA. Information has been sent to the insurance company. Awaiting determination.   

## 2019-08-26 NOTE — Telephone Encounter (Signed)
Received a fax from [employer] that Chantix was approved from 08/24/2019 through 08/23/2020. Pharmacy aware and forms sent to scan.

## 2019-09-16 ENCOUNTER — Encounter: Payer: Self-pay | Admitting: Sports Medicine

## 2019-09-16 ENCOUNTER — Ambulatory Visit (INDEPENDENT_AMBULATORY_CARE_PROVIDER_SITE_OTHER): Payer: 59 | Admitting: Sports Medicine

## 2019-09-16 DIAGNOSIS — M25522 Pain in left elbow: Secondary | ICD-10-CM

## 2019-09-16 DIAGNOSIS — M25521 Pain in right elbow: Secondary | ICD-10-CM | POA: Diagnosis not present

## 2019-09-16 DIAGNOSIS — E782 Mixed hyperlipidemia: Secondary | ICD-10-CM

## 2019-09-16 NOTE — Assessment & Plan Note (Signed)
Fernando Kelley has high lipids, they have been high for a couple of years now. He is going to do a low-cholesterol diet for 3 months and then we can recheck fasting lipids, we will likely add atorvastatin if no better.

## 2019-09-16 NOTE — Assessment & Plan Note (Signed)
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 We added Celebrex and he has improved considerably, he does have the option to go up to twice daily for further improvement.

## 2019-09-16 NOTE — Patient Instructions (Signed)

## 2019-09-16 NOTE — Progress Notes (Signed)
    Procedures performed today:    None.  Independent interpretation of tests performed by another provider:   None.  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 We added Celebrex and he has improved considerably, he does have the option to go up to twice daily for further improvement.  Hyperlipidemia, mixed Adar has high lipids, they have been high for a couple of years now. He is going to do a low-cholesterol diet for 3 months and then we can recheck fasting lipids, we will likely add atorvastatin if no better.    ___________________________________________ Ihor Austin. Benjamin Stain, M.D., ABFM., CAQSM. Primary Care and Sports Medicine [place] [facility]  Adjunct Instructor of Family Medicine  [employer]

## 2019-11-29 ENCOUNTER — Telehealth: Payer: Self-pay | Admitting: Sports Medicine

## 2019-11-29 NOTE — Telephone Encounter (Signed)
Received fax for PA on Chantix sent through cover my meds waiting on determination. - CF

## 2019-12-01 NOTE — Telephone Encounter (Signed)
Received fax from [employer] and they approved Chantix Tab 1mg  for an additional 93 day supply.   Reference #: Valid through 08/23/2020. - CF

## 2019-12-06 ENCOUNTER — Other Ambulatory Visit: Payer: Self-pay | Admitting: Sports Medicine

## 2019-12-06 DIAGNOSIS — F17200 Nicotine dependence, unspecified, uncomplicated: Secondary | ICD-10-CM

## 2019-12-06 DIAGNOSIS — Z87891 Personal history of nicotine dependence: Secondary | ICD-10-CM

## 2019-12-06 MED ORDER — VARENICLINE TARTRATE 1 MG PO TABS: 1.0000 mg | ORAL_TABLET | Freq: Two times a day (BID) | ORAL | 11 refills | Status: DC

## 2019-12-16 ENCOUNTER — Ambulatory Visit: Payer: 59 | Admitting: Sports Medicine

## 2020-01-07 LAB — LIPID PANEL W/REFLEX DIRECT LDL
Cholesterol: 217 mg/dL — ABNORMAL HIGH (ref <200)
HDL: 40 mg/dL (ref >=40)
LDL Cholesterol (Calc): 150 mg/dL (calc) — ABNORMAL HIGH
Non-HDL Cholesterol (Calc): 177 mg/dL (calc) — ABNORMAL HIGH (ref <130)
Total CHOL/HDL Ratio: 5.4 (calc) — ABNORMAL HIGH (ref <5.0)
Triglycerides: 147 mg/dL (ref <150)

## 2020-03-06 ENCOUNTER — Encounter: Payer: Self-pay | Admitting: Sports Medicine

## 2020-03-06 ENCOUNTER — Other Ambulatory Visit: Payer: Self-pay

## 2020-03-06 ENCOUNTER — Ambulatory Visit (INDEPENDENT_AMBULATORY_CARE_PROVIDER_SITE_OTHER): Payer: 59 | Admitting: Sports Medicine

## 2020-03-06 ENCOUNTER — Ambulatory Visit (INDEPENDENT_AMBULATORY_CARE_PROVIDER_SITE_OTHER): Payer: 59

## 2020-03-06 DIAGNOSIS — M62838 Other muscle spasm: Secondary | ICD-10-CM | POA: Diagnosis not present

## 2020-03-06 DIAGNOSIS — E782 Mixed hyperlipidemia: Secondary | ICD-10-CM

## 2020-03-06 MED ORDER — PREDNISONE 50 MG PO TABS: ORAL_TABLET | ORAL | 0 refills | Status: DC

## 2020-03-06 MED ORDER — CYCLOBENZAPRINE HCL 10 MG PO TABS: ORAL_TABLET | ORAL | 0 refills | Status: DC

## 2020-03-06 MED ORDER — MELOXICAM 15 MG PO TABS: ORAL_TABLET | ORAL | 3 refills | Status: DC

## 2020-03-06 MED ORDER — ATORVASTATIN CALCIUM 10 MG PO TABS: 10.0000 mg | ORAL_TABLET | Freq: Every day | ORAL | 3 refills | Status: DC

## 2020-03-06 NOTE — Assessment & Plan Note (Signed)
Lipids continue to be elevated, adding atorvastatin 10, recheck in 3 months.

## 2020-03-06 NOTE — Assessment & Plan Note (Signed)
For 3 days this pleasant 44 year old male has had pain radiating from his right occiput over his trapezius, nothing past the shoulder. Worse with prolonged downgaze. No nausea, photophobia, phonophobia, pain is quite positional. He did have some intermittent blurry vision that is resolved. No vertigo. I think he is having cervical paraspinal spasm. Adding cervical spine x-rays, prednisone, meloxicam, home rehab exercises, Flexeril, return to see me in a month. 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

## 2020-03-06 NOTE — Progress Notes (Signed)
    Procedures performed today:    None.  Independent interpretation of notes and tests performed by another provider:   I did review some old cervical spine x-rays from 2014 which show mild C5-C6 DDD.  Brief History, Exam, Impression, and Recommendations:    Cervical paraspinal muscle spasm For 3 days this pleasant 44 year old male has had pain radiating from his right occiput over his trapezius, nothing past the shoulder. Worse with prolonged downgaze. No nausea, photophobia, phonophobia, pain is quite positional. He did have some intermittent blurry vision that is resolved. No vertigo. I think he is having cervical paraspinal spasm. Adding cervical spine x-rays, prednisone, meloxicam, home rehab exercises, Flexeril, return to see me in a month. 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  Hyperlipidemia, mixed Lipids continue to be elevated, adding atorvastatin 10, recheck in 3 months.    ___________________________________________ Ihor Austin. Benjamin Stain, M.D., ABFM., CAQSM. Primary Care and Sports Medicine [place] [facility]  Adjunct Instructor of Family Medicine  [employer]

## 2020-04-06 ENCOUNTER — Ambulatory Visit: Payer: 59 | Admitting: Sports Medicine

## 2020-04-11 ENCOUNTER — Ambulatory Visit: Payer: 59 | Admitting: Sports Medicine

## 2020-04-24 ENCOUNTER — Encounter: Payer: Self-pay | Admitting: Sports Medicine

## 2020-04-24 ENCOUNTER — Other Ambulatory Visit: Payer: Self-pay

## 2020-04-24 ENCOUNTER — Ambulatory Visit (INDEPENDENT_AMBULATORY_CARE_PROVIDER_SITE_OTHER): Payer: 59 | Admitting: Sports Medicine

## 2020-04-24 DIAGNOSIS — H6122 Impacted cerumen, left ear: Secondary | ICD-10-CM | POA: Diagnosis not present

## 2020-04-24 DIAGNOSIS — M222X1 Patellofemoral disorders, right knee: Secondary | ICD-10-CM

## 2020-04-24 DIAGNOSIS — G4710 Hypersomnia, unspecified: Secondary | ICD-10-CM | POA: Insufficient documentation

## 2020-04-24 DIAGNOSIS — M62838 Other muscle spasm: Secondary | ICD-10-CM

## 2020-04-24 NOTE — Progress Notes (Signed)
    Procedures performed today:    Indication: Cerumen impaction of the left ear(s) Medical necessity statement: On physical examination, cerumen impairs clinically significant portions of the external auditory canal, and tympanic membrane. Noted obstructive, copious cerumen that cannot be removed without magnification and instrumentations requiring physician skills Consent: Discussed benefits and risks of procedure and verbal consent obtained Procedure: Patient was prepped for the procedure. Utilized an otoscope to assess and take note of the ear canal, the tympanic membrane, and the presence, amount, and placement of the cerumen. Soft plastic curette was utilized to remove cerumen.  Post procedure examination: shows cerumen was completely removed. Patient tolerated procedure well. The patient is made aware that they may experience temporary vertigo, temporary hearing loss, and temporary discomfort. If these symptom last for more than 24 hours to call the clinic or proceed to the ED.  Independent interpretation of notes and tests performed by another provider:   None.  Brief History, Exam, Impression, and Recommendations:    Impacted cerumen, left ear Removal with instrumentation today.  Cervical paraspinal muscle spasm Resolved with conservative treatment.  Excessive sleepiness Fernando Kelley has excessive daytime sleepiness, fatigue. He declines a sleep study for now. We will check some labs at a follow-up visit, he will try some caffeine/taurine in the meantime.  Patellofemoral syndrome, right Mild to moderate pain but tolerable with Celebrex.    ___________________________________________ Fernando Kelley. Benjamin Stain, M.D., ABFM., CAQSM. Primary Care and Sports Medicine [place] [facility]  Adjunct Instructor of Family Medicine  [employer]

## 2020-04-24 NOTE — Assessment & Plan Note (Signed)
Removal with instrumentation today.

## 2020-04-24 NOTE — Assessment & Plan Note (Addendum)
Fernando Kelley has excessive daytime sleepiness, fatigue. He declines a sleep study for now. We will check some labs at a follow-up visit, he will try some caffeine/taurine in the meantime.

## 2020-04-24 NOTE — Assessment & Plan Note (Signed)
Mild to moderate pain but tolerable with Celebrex.

## 2020-04-24 NOTE — Assessment & Plan Note (Signed)
Resolved with conservative treatment. 

## 2020-06-15 LAB — COMPLETE METABOLIC PANEL WITH GFR
AG Ratio: 2.1 (calc) (ref 1.0–2.5)
ALT: 53 U/L — ABNORMAL HIGH (ref 9–46)
AST: 30 U/L (ref 10–40)
Albumin: 4.6 g/dL (ref 3.6–5.1)
Alkaline phosphatase (APISO): 83 U/L (ref 36–130)
BUN: 12 mg/dL (ref 7–25)
CO2: 28 mmol/L (ref 20–32)
Calcium: 9.2 mg/dL (ref 8.6–10.3)
Chloride: 105 mmol/L (ref 98–110)
Creat: 1.05 mg/dL (ref 0.60–1.35)
GFR, Est African American: 101 mL/min/{1.73_m2} (ref >=60)
GFR, Est Non African American: 87 mL/min/{1.73_m2} (ref >=60)
Globulin: 2.2 g/dL (calc) (ref 1.9–3.7)
Glucose, Bld: 97 mg/dL (ref 65–99)
Potassium: 4.5 mmol/L (ref 3.5–5.3)
Sodium: 140 mmol/L (ref 135–146)
Total Bilirubin: 0.6 mg/dL (ref 0.2–1.2)
Total Protein: 6.8 g/dL (ref 6.1–8.1)

## 2020-06-15 LAB — LIPID PANEL W/REFLEX DIRECT LDL
Cholesterol: 116 mg/dL (ref <200)
HDL: 31 mg/dL — ABNORMAL LOW (ref >=40)
LDL Cholesterol (Calc): 71 mg/dL (calc)
Non-HDL Cholesterol (Calc): 85 mg/dL (calc) (ref <130)
Total CHOL/HDL Ratio: 3.7 (calc) (ref <5.0)
Triglycerides: 67 mg/dL (ref <150)

## 2020-07-10 ENCOUNTER — Telehealth (INDEPENDENT_AMBULATORY_CARE_PROVIDER_SITE_OTHER): Payer: 59 | Admitting: Sports Medicine

## 2020-07-10 DIAGNOSIS — B349 Viral infection, unspecified: Secondary | ICD-10-CM | POA: Diagnosis not present

## 2020-07-10 MED ORDER — HYDROCOD POLST-CPM POLST ER 10-8 MG/5ML PO SUER: 5.0000 mL | Freq: Two times a day (BID) | ORAL | 0 refills | Status: DC | PRN

## 2020-07-10 NOTE — Assessment & Plan Note (Signed)
This is a very pleasant 44 year old male, for the past week he has had fevers, chills, cough, headaches, muscle aches, body aches. Anosmia, ageusia. He did get tested for COVID-19, awaiting test results. He will quarantine for now, he can use over-the-counter cold and flu medications, stay hydrated, adding Tussionex for cough suppression. Return to see me in 2 weeks in a virtual visit.

## 2020-07-10 NOTE — Progress Notes (Signed)
Patient reports chest and head congestion (yellowish). Ears throbbing, sore throat, coughing.  Did have a COVID test done on Monday still awaiting results.  Has not had flu or COVID vaccines.  Tried Nyquil, Mucinex DM, ibuprofen.

## 2020-07-10 NOTE — Progress Notes (Signed)
   Virtual Visit via WebEx/MyChart   I connected with  Fernando Kelley  on 07/10/20 via WebEx/MyChart/Doximity Video and verified that I am speaking with the correct person using two identifiers.   I discussed the limitations, risks, security and privacy concerns of performing an evaluation and management service by WebEx/MyChart/Doximity Video, including the higher likelihood of inaccurate diagnosis and treatment, and the availability of in person appointments.  We also discussed the likely need of an additional face to face encounter for complete and high quality delivery of care.  I also discussed with the patient that there may be a patient responsible charge related to this service. The patient expressed understanding and wishes to proceed.  Provider location is in medical facility. Patient location is at their home, different from provider location. People involved in care of the patient during this telehealth encounter were myself, my nurse/medical assistant, and my front office/scheduling team member.  Review of Systems: No fevers, chills, night sweats, weight loss, chest pain, or shortness of breath.   Objective Findings:    General: Speaking full sentences, no audible heavy breathing.  Sounds alert and appropriately interactive.  Appears well.  Face symmetric.  Extraocular movements intact.  Pupils equal and round.  No nasal flaring or accessory muscle use visualized.  Independent interpretation of tests performed by another provider:   None.  Brief History, Exam, Impression, and Recommendations:    Viral syndrome This is a very pleasant 44 year old male, for the past week he has had fevers, chills, cough, headaches, muscle aches, body aches. Anosmia, ageusia. He did get tested for COVID-19, awaiting test results. He will quarantine for now, he can use over-the-counter cold and flu medications, stay hydrated, adding Tussionex for cough suppression. Return to see me in 2 weeks in  a virtual visit.   I discussed the above assessment and treatment plan with the patient. The patient was provided an opportunity to ask questions and all were answered. The patient agreed with the plan and demonstrated an understanding of the instructions.   The patient was advised to call back or seek an in-person evaluation if the symptoms worsen or if the condition fails to improve as anticipated.   I provided 30 minutes of face to face and non-face-to-face time during this encounter date, time was needed to gather information, review chart, records, communicate/coordinate with staff remotely, as well as complete documentation.   ___________________________________________ Ihor Austin. Benjamin Stain, M.D., ABFM., CAQSM. Primary Care and Sports Medicine [facility]  Adjunct Instructor of Family Medicine  [employer]

## 2020-07-11 ENCOUNTER — Other Ambulatory Visit: Payer: Self-pay

## 2020-07-11 ENCOUNTER — Ambulatory Visit (INDEPENDENT_AMBULATORY_CARE_PROVIDER_SITE_OTHER): Payer: 59

## 2020-07-11 DIAGNOSIS — B349 Viral infection, unspecified: Secondary | ICD-10-CM

## 2020-07-11 DIAGNOSIS — R059 Cough, unspecified: Secondary | ICD-10-CM | POA: Diagnosis not present

## 2021-03-21 DIAGNOSIS — E782 Mixed hyperlipidemia: Secondary | ICD-10-CM

## 2021-03-22 MED ORDER — ATORVASTATIN CALCIUM 10 MG PO TABS: 10.0000 mg | ORAL_TABLET | Freq: Every day | ORAL | 0 refills | Status: DC

## 2021-04-28 IMAGING — DX DG CHEST 2V
2 series · 2 of 2 positions shown · non-contrast
Comparison: January 02, 2015.

CLINICAL DATA: Cough.

EXAM:
CHEST - 2 VIEW

[chest pa]
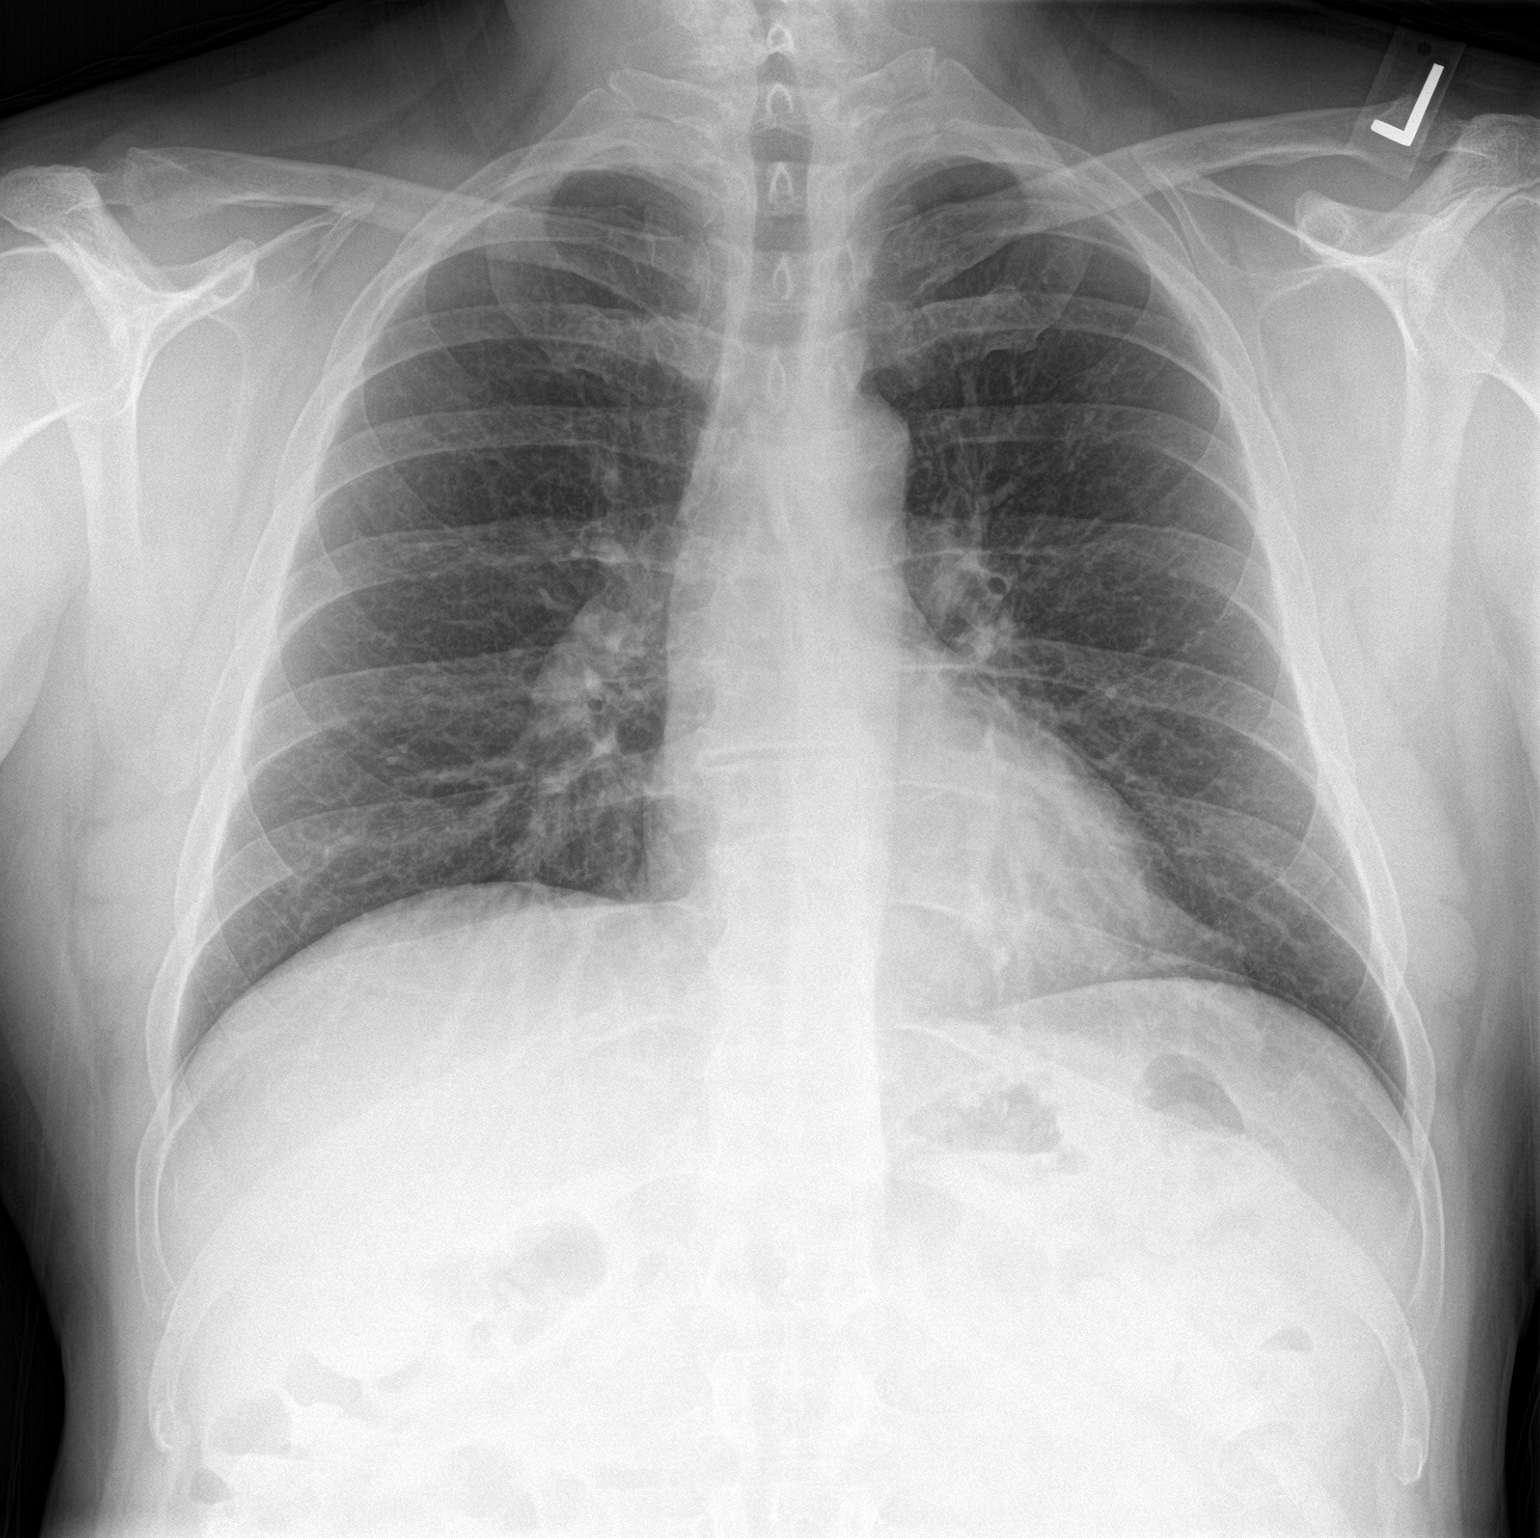

[chest lat]
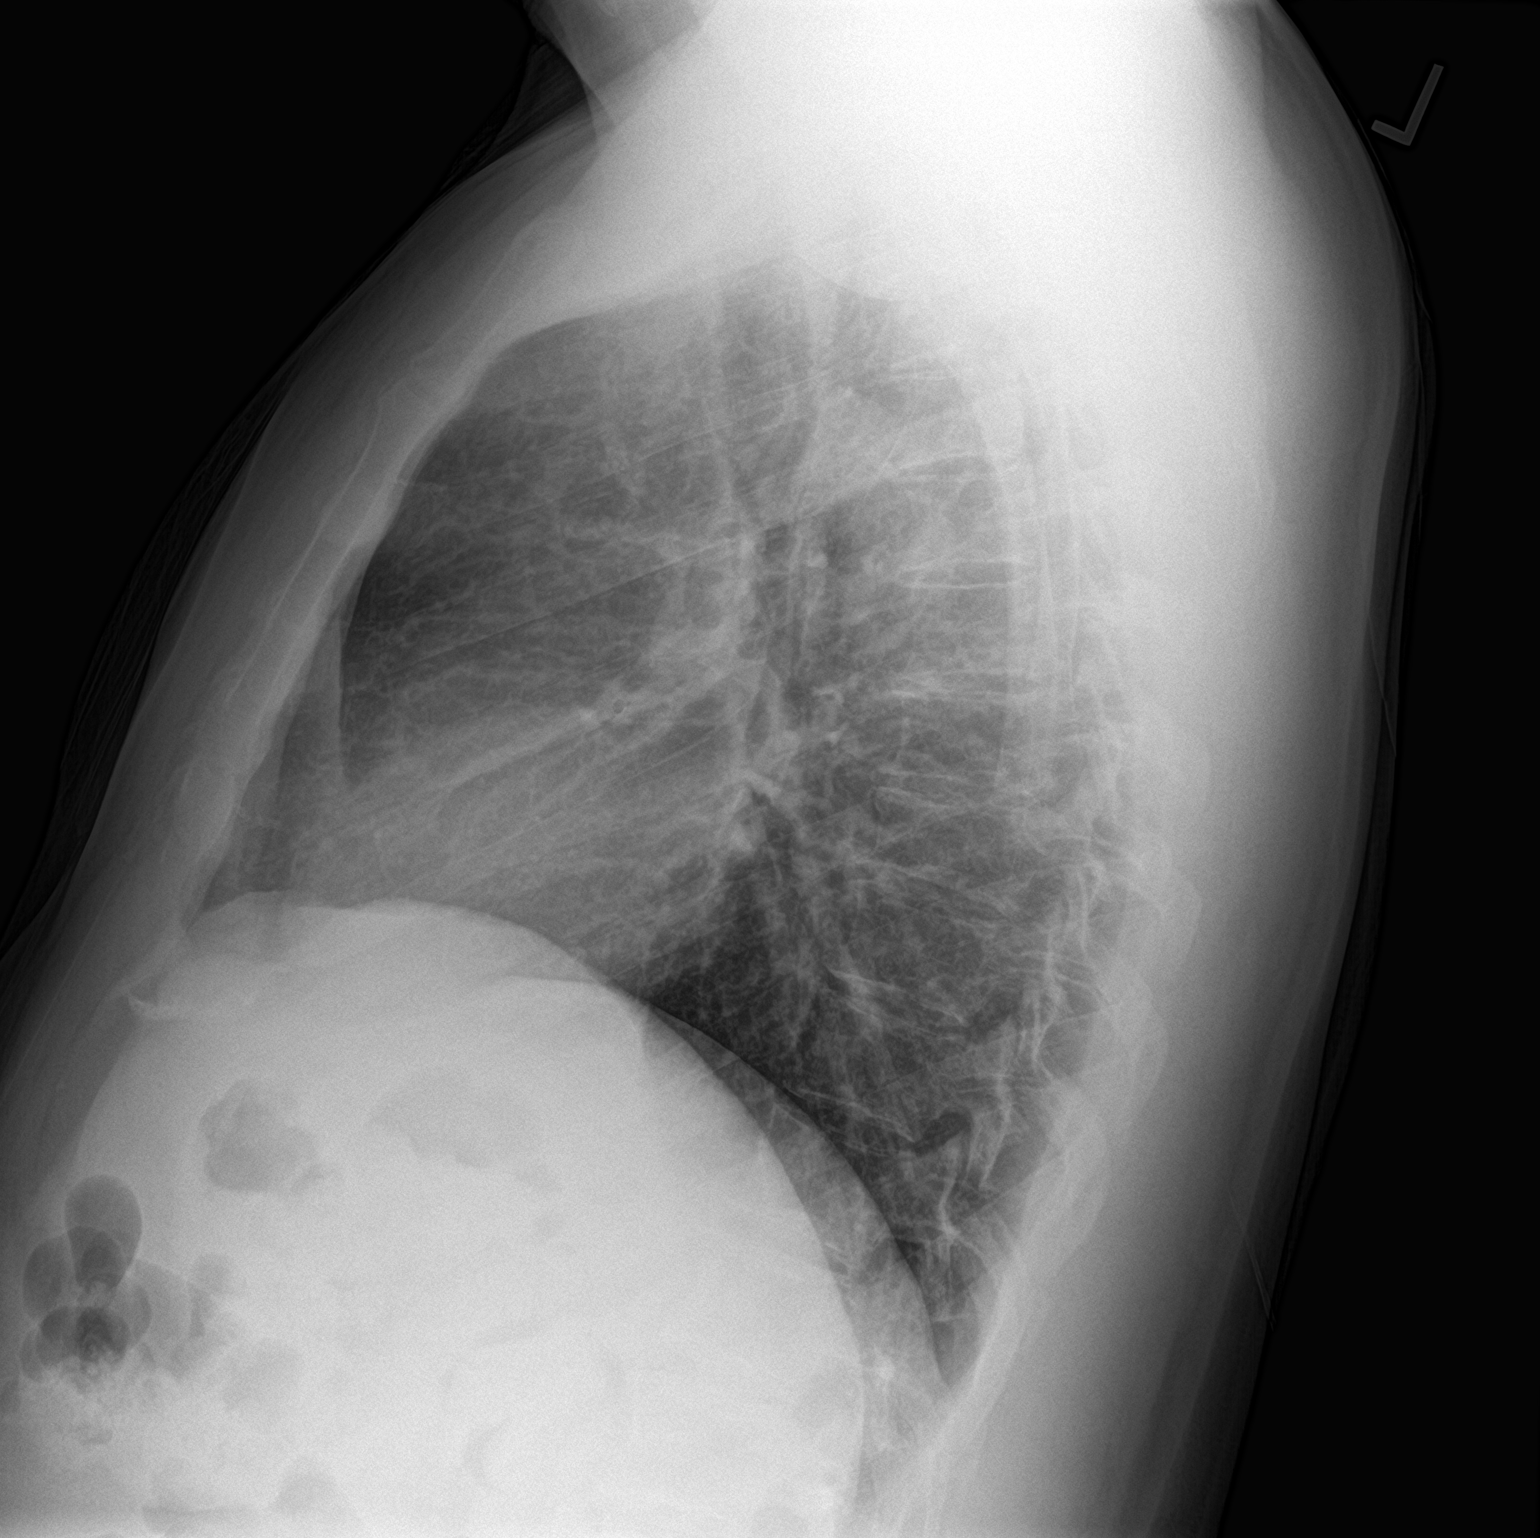

[2 of 2 positions shown; findings below may reference images not displayed]

FINDINGS: The heart size and mediastinal contours are within normal limits.
Both lungs are clear. The visualized skeletal structures are
unremarkable.
IMPRESSION: No active cardiopulmonary disease.

## 2021-06-07 ENCOUNTER — Telehealth (INDEPENDENT_AMBULATORY_CARE_PROVIDER_SITE_OTHER): Payer: 59 | Admitting: Sports Medicine

## 2021-06-07 DIAGNOSIS — U071 COVID-19: Secondary | ICD-10-CM | POA: Insufficient documentation

## 2021-06-07 DIAGNOSIS — E782 Mixed hyperlipidemia: Secondary | ICD-10-CM | POA: Diagnosis not present

## 2021-06-07 NOTE — Progress Notes (Signed)
   Virtual Visit via Telephone   I connected with  Clint Lipps  on 06/07/21 by telephone/telehealth and verified that I am speaking with the correct person using two identifiers.   I discussed the limitations, risks, security and privacy concerns of performing an evaluation and management service by telephone, including the higher likelihood of inaccurate diagnosis and treatment, and the availability of in person appointments.  We also discussed the likely need of an additional face to face encounter for complete and high quality delivery of care.  I also discussed with the patient that there may be a patient responsible charge related to this service. The patient expressed understanding and wishes to proceed.  Provider location is in medical facility. Patient location is at their home, different from provider location. People involved in care of the patient during this telehealth encounter were myself, my nurse/medical assistant, and my front office/scheduling team member.  Review of Systems: No fevers, chills, night sweats, weight loss, chest pain, or shortness of breath.   Objective Findings:    General: Speaking full sentences, no audible heavy breathing.  Sounds alert and appropriately interactive.    Independent interpretation of tests performed by another provider:   None.  Brief History, Exam, Impression, and Recommendations:    COVID-19 This is a pleasant 45 year old male, he has had a day or 2 of fevers, chills, muscle aches, body aches. He did take a home COVID test that was positive, he is out of work right now, declines Paxlovid, he will do over-the-counter cold and flu medications, he understands he needs to be out of work for at least 5 days followed by 5 days of tight mask wear, he just needs a letter confirming that he had an appointment with me today.  Hyperlipidemia, mixed Ordering routine labs, patient will come to see [place] at least 10 days from now, he will need  fasting labs.   I discussed the above assessment and treatment plan with the patient. The patient was provided an opportunity to ask questions and all were answered. The patient agreed with the plan and demonstrated an understanding of the instructions.   The patient was advised to call back or seek an in-person evaluation if the symptoms worsen or if the condition fails to improve as anticipated.   I provided 30 minutes of verbal and non-verbal time during this encounter date, time was needed to gather information, review chart, records, communicate/coordinate with staff remotely, as well as complete documentation.   ___________________________________________ Ihor Austin. Benjamin Stain, M.D., ABFM., CAQSM. Primary Care and Sports Medicine [facility]  Adjunct Professor of Family Medicine  [employer]

## 2021-06-07 NOTE — Assessment & Plan Note (Signed)
Ordering routine labs, patient will come to see [place] at least 10 days from now, he will need fasting labs.

## 2021-06-07 NOTE — Assessment & Plan Note (Signed)
This is a pleasant 45 year old male, he has had a day or 2 of fevers, chills, muscle aches, body aches. He did take a home COVID test that was positive, he is out of work right now, declines Paxlovid, he will do over-the-counter cold and flu medications, he understands he needs to be out of work for at least 5 days followed by 5 days of tight mask wear, he just needs a letter confirming that he had an appointment with me today.

## 2021-06-21 ENCOUNTER — Ambulatory Visit: Payer: 59 | Admitting: Sports Medicine

## 2021-06-21 ENCOUNTER — Other Ambulatory Visit: Payer: Self-pay

## 2021-06-21 DIAGNOSIS — M1710 Unilateral primary osteoarthritis, unspecified knee: Secondary | ICD-10-CM

## 2021-06-21 DIAGNOSIS — Z0000 Encounter for general adult medical examination without abnormal findings: Secondary | ICD-10-CM | POA: Diagnosis not present

## 2021-06-21 DIAGNOSIS — E782 Mixed hyperlipidemia: Secondary | ICD-10-CM

## 2021-06-21 MED ORDER — CELECOXIB 200 MG PO CAPS: ORAL_CAPSULE | ORAL | 2 refills | Status: DC

## 2021-06-21 MED ORDER — ATORVASTATIN CALCIUM 10 MG PO TABS: 10.0000 mg | ORAL_TABLET | Freq: Every day | ORAL | 3 refills | Status: DC

## 2021-06-21 NOTE — Assessment & Plan Note (Signed)
Annual physical as above.  

## 2021-06-21 NOTE — Assessment & Plan Note (Signed)
We have treated this about a year ago, he did well with Celebrex, has since been out of this, refilling Celebrex, exam is benign. Return to see me as needed, injection if no better.

## 2021-06-21 NOTE — Assessment & Plan Note (Signed)
Ordering routine labs, refilling Lipitor.

## 2021-06-21 NOTE — Progress Notes (Signed)
  Subjective:    CC: Annual Physical Exam  HPI:  This patient is here for their annual physical  I reviewed the past medical history, family history, social history, surgical history, and allergies today and no changes were needed.  Please see the problem list section below in epic for further details.  Past Medical History: No past medical history on file. Past Surgical History: No past surgical history on file. Social History: Social History   Socioeconomic History   Marital status: Married    Spouse name: Not on file   Number of children: Not on file   Years of education: Not on file   Highest education level: Not on file  Occupational History   Not on file  Tobacco Use   Smoking status: Every Day    Types: Cigarettes   Smokeless tobacco: Never  Substance and Sexual Activity   Alcohol use: Yes   Drug use: No   Sexual activity: Not on file  Other Topics Concern   Not on file  Social History Narrative   Not on file   Social Determinants of Health   Financial Resource Strain: Not on file  Food Insecurity: Not on file  Transportation Needs: Not on file  Physical Activity: Not on file  Stress: Not on file  Social Connections: Not on file   Family History: No family history on file. Allergies: No Known Allergies Medications: See med rec.  Review of Systems: No headache, visual changes, nausea, vomiting, diarrhea, constipation, dizziness, abdominal pain, skin rash, fevers, chills, night sweats, swollen lymph nodes, weight loss, chest pain, body aches, joint swelling, muscle aches, shortness of breath, mood changes, visual or auditory hallucinations.  Objective:    General: Well Developed, well nourished, and in no acute distress.  Neuro: Alert and oriented x3, extra-ocular muscles intact, sensation grossly intact. Cranial nerves II through XII are intact, motor, sensory, and coordinative functions are all intact. HEENT: Normocephalic, atraumatic, pupils equal  round reactive to light, neck supple, no masses, no lymphadenopathy, thyroid nonpalpable. Oropharynx, nasopharynx, external ear canals are unremarkable. Skin: Warm and dry, no rashes noted.  Cardiac: Regular rate and rhythm, no murmurs rubs or gallops.  Respiratory: Clear to auscultation bilaterally. Not using accessory muscles, speaking in full sentences.  Abdominal: Soft, nontender, nondistended, positive bowel sounds, no masses, no organomegaly.  Musculoskeletal: Shoulder, elbow, wrist, hip, knee, ankle stable, and with full range of motion.  Impression and Recommendations:    The patient was counselled, risk factors were discussed, anticipatory guidance given.  Patellofemoral arthritis We have treated this about a year ago, he did well with Celebrex, has since been out of this, refilling Celebrex, exam is benign. Return to see me as needed, injection if no better.  Hyperlipidemia, mixed Ordering routine labs, refilling Lipitor.  Annual physical exam Annual physical as above.  ___________________________________________ Ihor Austin. Benjamin Stain, M.D., ABFM., CAQSM. Primary Care and Sports Medicine [facility]  Adjunct Professor of Family Medicine  [employer]

## 2021-08-09 LAB — COMPREHENSIVE METABOLIC PANEL
AG Ratio: 2.2 (calc) (ref 1.0–2.5)
ALT: 37 U/L (ref 9–46)
AST: 21 U/L (ref 10–40)
Albumin: 4.4 g/dL (ref 3.6–5.1)
Alkaline phosphatase (APISO): 67 U/L (ref 36–130)
BUN: 13 mg/dL (ref 7–25)
CO2: 23 mmol/L (ref 20–32)
Calcium: 8.8 mg/dL (ref 8.6–10.3)
Chloride: 108 mmol/L (ref 98–110)
Creat: 0.98 mg/dL (ref 0.60–1.29)
Globulin: 2 g/dL (calc) (ref 1.9–3.7)
Glucose, Bld: 100 mg/dL — ABNORMAL HIGH (ref 65–99)
Potassium: 4.4 mmol/L (ref 3.5–5.3)
Sodium: 139 mmol/L (ref 135–146)
Total Bilirubin: 0.4 mg/dL (ref 0.2–1.2)
Total Protein: 6.4 g/dL (ref 6.1–8.1)

## 2021-08-09 LAB — LIPID PANEL
Cholesterol: 156 mg/dL (ref <200)
HDL: 40 mg/dL (ref >=40)
LDL Cholesterol (Calc): 101 mg/dL (calc) — ABNORMAL HIGH
Non-HDL Cholesterol (Calc): 116 mg/dL (calc) (ref <130)
Total CHOL/HDL Ratio: 3.9 (calc) (ref <5.0)
Triglycerides: 60 mg/dL (ref <150)

## 2021-08-09 LAB — CBC
HCT: 42.5 % (ref 38.5–50.0)
Hemoglobin: 14.5 g/dL (ref 13.2–17.1)
MCH: 31.5 pg (ref 27.0–33.0)
MCHC: 34.1 g/dL (ref 32.0–36.0)
MCV: 92.2 fL (ref 80.0–100.0)
MPV: 10.1 fL (ref 7.5–12.5)
Platelets: 284 10*3/uL (ref 140–400)
RBC: 4.61 10*6/uL (ref 4.20–5.80)
RDW: 12.2 % (ref 11.0–15.0)
WBC: 6.2 10*3/uL (ref 3.8–10.8)

## 2021-08-09 LAB — TSH: TSH: 1.07 mIU/L (ref 0.40–4.50)

## 2021-11-21 ENCOUNTER — Emergency Department (INDEPENDENT_AMBULATORY_CARE_PROVIDER_SITE_OTHER): Payer: 59

## 2021-11-21 ENCOUNTER — Emergency Department
Admission: EM | Admit: 2021-11-21 | Discharge: 2021-11-21 | Disposition: A | Discharge status: Other Acute Inpatient Hospital | Payer: 59 | Source: Home / Self Care

## 2021-11-21 ENCOUNTER — Encounter: Payer: Self-pay | Admitting: Emergency Medicine

## 2021-11-21 DIAGNOSIS — R071 Chest pain on breathing: Secondary | ICD-10-CM | POA: Diagnosis not present

## 2021-11-21 DIAGNOSIS — R0789 Other chest pain: Secondary | ICD-10-CM

## 2021-11-21 NOTE — ED Notes (Signed)
Patient is being discharged from the Urgent Care and sent to the Emergency Department via personal care . Per Trevor Iha, NP, patient is in need of higher level of care due to chest pain and dyspnea. Patient is aware and verbalizes understanding of plan of care.  ?Vitals:  ? 11/21/21 0947  ?BP: (!) 149/95  ?Pulse: 88  ?Resp: 16  ?Temp: 98.7 ?F (37.1 ?C)  ?SpO2: 95%  ?   ?

## 2021-11-21 NOTE — ED Triage Notes (Addendum)
Patient presents to Urgent Care with complaints of chest pain since 3 weeks ago. Patient reports pain is constant. Denies any back pain or arm pain. Denies any covid symptoms. Did have some coughing recently. Does have chest tightness, taking deep breath does hurt. Does do lifting at work but no recent injury. Has been taking 10 ibuprofen so far today.  ?

## 2021-11-21 NOTE — Discharge Instructions (Addendum)
Advised patient to go to [facility] now for immediate evaluation of chest pain with breathing to include serial troponins and CT of chest with contrast to rule out pulmonary embolism given smoking history and history of hyperlipidemia.  Patient agreed and verbalized understanding of these instructions and this plan of care this morning.  Staff RN has called ahead to this emergency department to alert them of patient's impending arrival. ?

## 2021-11-21 NOTE — ED Provider Notes (Signed)
?[facility] ? ? ? ?CSN: [id number] ?Arrival date & time: 11/21/21  0272 ? ? ?  ? ?History   ?Chief Complaint ?Chief Complaint  ?Patient presents with  ? Chest Pain  ? ? ?HPI ?Fernando Kelley is a 46 y.o. male.  ? ?HPI 46 year old male presents with chest pain for 3 weeks.  Reports chest pain is constant and worsens with breathing.  Denies radiation to arms neck or jaw.  Patient denies recent injury or insult, and/or vigorous activities involving chest.  Patient reports taking 10 ibuprofen so far today.  PMH significant for ADHD and mixed hyperlipidemia.  Patient is a current every day cigarette smoker reports 0.5 packs/day with 25-pack-year history.  Denies dizziness, lightheadedness, changes in visual acuity, claudication, lower extremity edema, presyncopal or syncopal episodes. ? ?History reviewed. No pertinent past medical history. ? ?Patient Active Problem List  ? Diagnosis Date Noted  ? COVID-19 06/07/2021  ? Excessive sleepiness 04/24/2020  ? Cervical paraspinal muscle spasm 03/06/2020  ? Patellofemoral arthritis 07/02/2018  ? Hyperlipidemia, mixed 01/25/2017  ? Annual physical exam 04/28/2013  ? ADHD (attention deficit hyperactivity disorder) 10/31/2011  ? ? ?History reviewed. No pertinent surgical history. ? ? ? ? ?Home Medications   ? ?Prior to Admission medications   ?Medication Sig Start Date End Date Taking? Authorizing Provider  ?atorvastatin (LIPITOR) 10 MG tablet Take 1 tablet (10 mg total) by mouth daily. 06/21/21  Yes Monica Becton, MD  ?celecoxib (CELEBREX) 200 MG capsule One to 2 tablets by mouth daily as needed for pain. 06/21/21   Monica Becton, MD  ? ? ?Family History ?Family History  ?Problem Relation Age of Onset  ? Cancer Mother   ? Heart attack Father   ? ? ?Social History ?Social History  ? ?Tobacco Use  ? Smoking status: Every Day  ?  Packs/day: 0.50  ?  Years: 25.00  ?  Pack years: 12.50  ?  Types: Cigarettes  ? Smokeless tobacco: Never  ?Substance Use Topics   ? Alcohol use: Yes  ? Drug use: No  ? ? ? ?Allergies   ?Patient has no known allergies. ? ? ?Review of Systems ?Review of Systems  ?Cardiovascular:  Positive for chest pain.  ?All other systems reviewed and are negative. ? ? ?Physical Exam ?Triage Vital Signs ?ED Triage Vitals  ?Enc Vitals Group  ?   BP 11/21/21 0947 (!) 149/95  ?   Pulse Rate 11/21/21 0947 88  ?   Resp 11/21/21 0947 16  ?   Temp 11/21/21 0947 98.7 ?F (37.1 ?C)  ?   Temp Source 11/21/21 0947 Oral  ?   SpO2 11/21/21 0947 95 %  ?   Weight --   ?   Height --   ?   Head Circumference --   ?   Peak Flow --   ?   Pain Score 11/21/21 0944 4  ?   Pain Loc --   ?   Pain Edu? --   ?   Excl. in GC? --   ? ?No data found. ? ?Updated Vital Signs ?BP (!) 149/95 (BP Location: Left Arm)   Pulse 88   Temp 98.7 ?F (37.1 ?C) (Oral)   Resp 16   SpO2 95%  ? ? ? ?Physical Exam ?Vitals and nursing note reviewed.  ?Constitutional:   ?   Appearance: Normal appearance. He is obese.  ?HENT:  ?   Head: Normocephalic and atraumatic.  ?   Mouth/Throat:  ?  Mouth: Mucous membranes are moist.  ?   Pharynx: Oropharynx is clear.  ?Eyes:  ?   Extraocular Movements: Extraocular movements intact.  ?   Conjunctiva/sclera: Conjunctivae normal.  ?   Pupils: Pupils are equal, round, and reactive to light.  ?Neck:  ?   Comments: No JVD, no bruit ?Cardiovascular:  ?   Rate and Rhythm: Normal rate and regular rhythm.  ?   Pulses: Normal pulses.  ?   Heart sounds: Normal heart sounds. No murmur heard. ?  No friction rub. No gallop.  ?   Comments: PT +2 bounding bilaterally ?Pulmonary:  ?   Effort: Pulmonary effort is normal.  ?   Breath sounds: Normal breath sounds. No wheezing, rhonchi or rales.  ?   Comments: Patient reports chest pain on breathing throughout my lung exam this morning ?Musculoskeletal:  ?   Cervical back: Normal range of motion and neck supple.  ?Skin: ?   General: Skin is warm and dry.  ?Neurological:  ?   General: No focal deficit present.  ?   Mental Status: He is  alert and oriented to person, place, and time.  ? ? ? ?UC Treatments / Results  ?Labs ?(all labs ordered are listed, but only abnormal results are displayed) ?Labs Reviewed - No data to display ? ?EKG ? ? ?Radiology ?DG Chest 2 View ? ?Result Date: 11/21/2021 ?CLINICAL DATA:  Chest pain. EXAM: CHEST - 2 VIEW COMPARISON:  July 11, 2020. FINDINGS: The heart size and mediastinal contours are within normal limits. Both lungs are clear. The visualized skeletal structures are unremarkable. IMPRESSION: No active cardiopulmonary disease. Electronically Signed   By: Lupita Raider M.D.   On: 11/21/2021 10:21   ? ?Procedures ?Procedures (including critical care time) ? ?Medications Ordered in UC ?Medications - No data to display ? ?Initial Impression / Assessment and Plan / UC Course  ?I have reviewed the triage vital signs and the nursing notes. ? ?Pertinent labs & imaging results that were available during my care of the patient were reviewed by me and considered in my medical decision making (see chart for details). ? ?  ? ?MDM: 1.  Chest pain varying with breathing-Advised patient to go to [facility] now for immediate evaluation of chest pain with breathing to include serial troponins and CT of chest with contrast to rule out pulmonary embolism given smoking history and history of hyperlipidemia.  Patient agreed and verbalized understanding of these instructions and this plan of care this morning.  Staff RN has called ahead to this emergency department to alert them of patient's impending arrival. 2. Atypical chest pain-EKG reveals normal sinus rhythm with no other EKG to compare.  CXR reveals no active cardiopulmonary disease.  Patient discharged hemodynamically stable. ? ?Final Clinical Impressions(s) / UC Diagnoses  ? ?Final diagnoses:  ?Atypical chest pain  ?Chest pain varying with breathing  ? ? ? ?Discharge Instructions   ? ?  ?Advised patient to go to [facility] now for immediate evaluation of chest pain with  breathing to include serial troponins and CT of chest with contrast to rule out pulmonary embolism given smoking history and history of hyperlipidemia.  Patient agreed and verbalized understanding of these instructions and this plan of care this morning.  Staff RN has called ahead to this emergency department to alert them of patient's impending arrival. ? ? ? ? ?ED Prescriptions   ?None ?  ? ?PDMP not reviewed this encounter. ?  ?Trevor Iha, FNP ?11/21/21 1057 ? ?

## 2021-11-22 ENCOUNTER — Encounter: Payer: Self-pay | Admitting: Sports Medicine

## 2021-11-22 NOTE — Telephone Encounter (Signed)
Patient has been scheduled with Dr T for 11/28/2021. ?

## 2021-11-25 ENCOUNTER — Telehealth: Payer: Self-pay | Admitting: General Practice

## 2021-11-25 NOTE — Telephone Encounter (Signed)
Transition Care Management Follow-up Telephone Call ?Date of discharge and from where: 11/21/21 from [facility] ?How have you been since you were released from the hospital? Patient stated that he is still in pain. He is doing ok otherwise.  ?Any questions or concerns? No ? ?Items Reviewed: ?Did the pt receive and understand the discharge instructions provided? Yes  ?Medications obtained and verified? No  ?Other? No  ?Any new allergies since your discharge? No  ?Dietary orders reviewed? Yes ?Do you have support at home? Yes  ? ?Home Care and Equipment/Supplies: ?Were home health services ordered? no ? ?Functional Questionnaire: (I = Independent and D = Dependent) ?ADLs: I ? ?Bathing/Dressing- I ? ?Meal Prep- I ? ?Eating- I ? ?Maintaining continence- I ? ?Transferring/Ambulation- I ? ?Managing Meds- I ? ?Follow up appointments reviewed: ? ?PCP Hospital f/u appt confirmed? Yes  Scheduled to see PCP on 11/28/21 @ 9. ?Specialist Hospital f/u appt confirmed? No   ?Are transportation arrangements needed? No  ?If their condition worsens, is the pt aware to call PCP or go to the Emergency Dept.? Yes ?Was the patient provided with contact information for the PCP's office or ED? Yes ?Was to pt encouraged to call back with questions or concerns? Yes  ?

## 2021-11-28 ENCOUNTER — Ambulatory Visit: Payer: 59 | Admitting: Sports Medicine

## 2021-11-28 DIAGNOSIS — M940 Chondrocostal junction syndrome [Tietze]: Secondary | ICD-10-CM

## 2021-11-28 MED ORDER — PREDNISONE 50 MG PO TABS: ORAL_TABLET | ORAL | 0 refills | Status: DC

## 2021-11-28 NOTE — Progress Notes (Signed)
? ? ?  Procedures performed today:   ? ?None. ? ?Independent interpretation of notes and tests performed by another provider:  ? ?None. ? ?Brief History, Exam, Impression, and Recommendations:   ? ?Costochondritis ?Pleasant 46 year old male, for the past couple weeks he has had some discomfort in his left anterior chest, worse with deep breathing, as well as activating his pectoralis muscle. ?He was seen in urgent care and transferred to the ED, in the ED he had some labs, ECG, labs including D-dimer and cardiac enzymes, all of which were unrevealing. ?Chest x-ray unrevealing.  ?On exam today he has discrete tenderness left costal cartilages completely consistent with costochondritis. ?Pleurisy is also in the differential though less likely considering reproduction of pain with palpation of his left costal cartilage. ?We will do a 5-day course of steroids, he can take over-the-counter NSAIDs afterwards, information provided, return to see me as needed for this. ? ? ? ?___________________________________________ ?Fernando Kelley. Fernando Kelley, M.D., ABFM., CAQSM. ?Primary Care and Sports Medicine ?[facility] ? ?Adjunct Instructor of Family Medicine  ?[employer] ?

## 2021-11-28 NOTE — Assessment & Plan Note (Addendum)
Pleasant 46 year old male, for the past couple weeks he has had some discomfort in his left anterior chest, worse with deep breathing, as well as activating his pectoralis muscle. ?He was seen in urgent care and transferred to the ED, in the ED he had some labs, ECG, labs including D-dimer and cardiac enzymes, all of which were unrevealing. ?Chest x-ray unrevealing.  ?On exam today he has discrete tenderness left costal cartilages completely consistent with costochondritis. ?Pleurisy is also in the differential though less likely considering reproduction of pain with palpation of his left costal cartilage. ?We will do a 5-day course of steroids, he can take over-the-counter NSAIDs afterwards, information provided, return to see me as needed for this. ?

## 2022-04-28 ENCOUNTER — Telehealth: Payer: Self-pay | Admitting: General Practice

## 2022-04-28 NOTE — Telephone Encounter (Signed)
Transition Care Management Unsuccessful Follow-up Telephone Call  Date of discharge and from where:  04/26/22 from [facility]  Attempts:  1st Attempt  Reason for unsuccessful TCM follow-up call:  No answer/busy

## 2022-04-30 NOTE — Telephone Encounter (Signed)
Transition Care Management Unsuccessful Follow-up Telephone Call  Date of discharge and from where:  04/26/22 from [facility]  Attempts:  2nd Attempt  Reason for unsuccessful TCM follow-up call:  No answer/busy

## 2022-05-02 NOTE — Telephone Encounter (Signed)
Transition Care Management Unsuccessful Follow-up Telephone Call  Date of discharge and from where:  04/26/22 from [facility]   Attempts:  3rd Attempt  Reason for unsuccessful TCM follow-up call:  No answer/busy

## 2022-05-21 ENCOUNTER — Telehealth: Payer: Self-pay | Admitting: General Practice

## 2022-05-21 NOTE — Telephone Encounter (Signed)
Transition Care Management Unsuccessful Follow-up Telephone Call  Date of discharge and from where:  05/19/22 from [facility]  Attempts:  1st Attempt  Reason for unsuccessful TCM follow-up call:  No answer/busy

## 2022-05-23 NOTE — Telephone Encounter (Signed)
Transition Care Management Unsuccessful Follow-up Telephone Call  Date of discharge and from where:  05/19/22 from [facility]  Attempts:  2nd Attempt  Reason for unsuccessful TCM follow-up call:  No answer/busy

## 2022-06-02 NOTE — Telephone Encounter (Signed)
Transition Care Management Unsuccessful Follow-up Telephone Call  Date of discharge and from where:  05/19/22 from [facility]  Attempts:  3rd Attempt  Reason for unsuccessful TCM follow-up call:  No answer/busy

## 2022-07-14 ENCOUNTER — Other Ambulatory Visit: Payer: Self-pay | Admitting: Sports Medicine

## 2022-07-14 DIAGNOSIS — E782 Mixed hyperlipidemia: Secondary | ICD-10-CM

## 2022-08-12 ENCOUNTER — Encounter: Payer: Self-pay | Admitting: Sports Medicine

## 2022-08-12 DIAGNOSIS — E782 Mixed hyperlipidemia: Secondary | ICD-10-CM

## 2022-08-12 MED ORDER — ATORVASTATIN CALCIUM 10 MG PO TABS: 10.0000 mg | ORAL_TABLET | Freq: Every day | ORAL | 3 refills | Status: DC

## 2022-08-22 ENCOUNTER — Encounter: Payer: Self-pay | Admitting: Sports Medicine

## 2022-08-22 ENCOUNTER — Ambulatory Visit (INDEPENDENT_AMBULATORY_CARE_PROVIDER_SITE_OTHER): Payer: 59 | Admitting: Sports Medicine

## 2022-08-22 VITALS — BP 144/88 | HR 95 | Wt 214.0 lb

## 2022-08-22 DIAGNOSIS — G4710 Hypersomnia, unspecified: Secondary | ICD-10-CM | POA: Diagnosis not present

## 2022-08-22 DIAGNOSIS — L84 Corns and callosities: Secondary | ICD-10-CM

## 2022-08-22 DIAGNOSIS — E538 Deficiency of other specified B group vitamins: Secondary | ICD-10-CM

## 2022-08-22 DIAGNOSIS — R5383 Other fatigue: Secondary | ICD-10-CM

## 2022-08-22 DIAGNOSIS — R21 Rash and other nonspecific skin eruption: Secondary | ICD-10-CM

## 2022-08-22 DIAGNOSIS — Z1211 Encounter for screening for malignant neoplasm of colon: Secondary | ICD-10-CM

## 2022-08-22 DIAGNOSIS — H6122 Impacted cerumen, left ear: Secondary | ICD-10-CM

## 2022-08-22 DIAGNOSIS — E559 Vitamin D deficiency, unspecified: Secondary | ICD-10-CM

## 2022-08-22 DIAGNOSIS — Z0000 Encounter for general adult medical examination without abnormal findings: Secondary | ICD-10-CM | POA: Diagnosis not present

## 2022-08-22 DIAGNOSIS — E291 Testicular hypofunction: Secondary | ICD-10-CM

## 2022-08-22 NOTE — Progress Notes (Addendum)
Subjective:    CC: Annual Physical Exam  HPI:  This patient is here for their annual physical  I reviewed the past medical history, family history, social history, surgical history, and allergies today and no changes were needed.  Please see the problem list section below in epic for further details.  Past Medical History: No past medical history on file. Past Surgical History: No past surgical history on file. Social History: Social History   Socioeconomic History   Marital status: Married    Spouse name: Not on file   Number of children: Not on file   Years of education: Not on file   Highest education level: Not on file  Occupational History   Not on file  Tobacco Use   Smoking status: Every Day    Packs/day: 0.50    Years: 25.00    Total pack years: 12.50    Types: Cigarettes   Smokeless tobacco: Never  Substance and Sexual Activity   Alcohol use: Yes   Drug use: No   Sexual activity: Not on file  Other Topics Concern   Not on file  Social History Narrative   Not on file   Social Determinants of Health   Financial Resource Strain: Not on file  Food Insecurity: Not on file  Transportation Needs: Not on file  Physical Activity: Not on file  Stress: Not on file  Social Connections: Not on file   Family History: Family History  Problem Relation Age of Onset   Cancer Mother    Heart attack Father    Allergies: No Known Allergies Medications: See med rec.  Review of Systems: No headache, visual changes, nausea, vomiting, diarrhea, constipation, dizziness, abdominal pain, skin rash, fevers, chills, night sweats, swollen lymph nodes, weight loss, chest pain, body aches, joint swelling, muscle aches, shortness of breath, mood changes, visual or auditory hallucinations.  Objective:    General: Well Developed, well nourished, and in no acute distress.  Neuro: Alert and oriented x3, extra-ocular muscles intact, sensation grossly intact. Cranial nerves II  through XII are intact, motor, sensory, and coordinative functions are all intact. HEENT: Normocephalic, atraumatic, pupils equal round reactive to light, neck supple, no masses, no lymphadenopathy, thyroid nonpalpable. Oropharynx, nasopharynx unremarkable, left external canal occluded with cerumen. Skin: Warm and dry, no rashes noted.  Cardiac: Regular rate and rhythm, no murmurs rubs or gallops.  Respiratory: Clear to auscultation bilaterally. Not using accessory muscles, speaking in full sentences.  Abdominal: Soft, nontender, nondistended, positive bowel sounds, no masses, no organomegaly.  Musculoskeletal: Shoulder, elbow, wrist, hip, knee, ankle stable, and with full range of motion.  Indication: Cerumen impaction of the left ear(s) Medical necessity statement: On physical examination, cerumen impairs clinically significant portions of the external auditory canal, and tympanic membrane. Noted obstructive, copious cerumen that cannot be removed without magnification and instrumentations requiring physician skills Consent: Discussed benefits and risks of procedure and verbal consent obtained Procedure: Patient was prepped for the procedure. Utilized an otoscope to assess and take note of the ear canal, the tympanic membrane, and the presence, amount, and placement of the cerumen. Gentle water irrigation was utilized to remove cerumen.  Post procedure examination: shows cerumen was completely removed. Patient tolerated procedure well. The patient is made aware that they may experience temporary vertigo, temporary hearing loss, and temporary discomfort. If these symptom last for more than 24 hours to call the clinic or proceed to the ED.  Impression and Recommendations:    The patient was counselled, risk  factors were discussed, anticipatory guidance given.  Annual physical exam Annual physical as above. Declines flu shot. We did irrigate out a left-sided cerumen  impaction. Cologuard.  Excessive sleepiness Persistent excessive daytime sleepiness, he has a very tough job, wakes up at 4 AM, gets home later in the day. He tends to try to go to sleep at about 8 PM and typically is asleep within a few minutes, will sometimes wake up once or twice to urinate, and then wakes up in the morning at 4 AM to get to work. On nights that he is able to sleep through the night he feels pretty good in the morning, but on nights that he has to wake up to void he does not. He does drink a lot of fluids right for bedtime, and I have advised him to try to move this back maybe 2 or 3 hours. Will do a workup including sleep study.  Corn of foot Referral to podiatry for excision.  Skin rash History of folliculitis, treated historically with benzyl peroxide and topical clindamycin with a dermatologist in [place] about 10 years ago. Referral back to the same dermatology clinic per patient request   ____________________________________________ Gwen Her. Dianah Field, M.D., ABFM., CAQSM., AME. Primary Care and Sports Medicine [facility]  Adjunct Professor of [place] [employer]  Risk manager

## 2022-08-22 NOTE — Assessment & Plan Note (Addendum)
Annual physical as above. Declines flu shot. We did irrigate out a left-sided cerumen impaction. Cologuard.

## 2022-08-22 NOTE — Assessment & Plan Note (Addendum)
History of folliculitis, treated historically with benzyl peroxide and topical clindamycin with a dermatologist in [place] about 10 years ago. Referral back to the same dermatology clinic per patient request

## 2022-08-22 NOTE — Assessment & Plan Note (Signed)
Persistent excessive daytime sleepiness, he has a very tough job, wakes up at 4 AM, gets home later in the day. He tends to try to go to sleep at about 8 PM and typically is asleep within a few minutes, will sometimes wake up once or twice to urinate, and then wakes up in the morning at 4 AM to get to work. On nights that he is able to sleep through the night he feels pretty good in the morning, but on nights that he has to wake up to void he does not. He does drink a lot of fluids right for bedtime, and I have advised him to try to move this back maybe 2 or 3 hours. Will do a workup including sleep study.

## 2022-08-22 NOTE — Assessment & Plan Note (Signed)
Referral to podiatry for excision.

## 2022-08-22 NOTE — Addendum Note (Signed)
Addended by: Silverio Decamp on: 08/22/2022 03:39 PM   Modules accepted: Orders

## 2022-08-29 ENCOUNTER — Encounter: Payer: Self-pay | Admitting: Podiatry

## 2022-08-29 ENCOUNTER — Ambulatory Visit (INDEPENDENT_AMBULATORY_CARE_PROVIDER_SITE_OTHER): Payer: 59 | Admitting: Podiatry

## 2022-08-29 DIAGNOSIS — L851 Acquired keratosis [keratoderma] palmaris et plantaris: Secondary | ICD-10-CM | POA: Diagnosis not present

## 2022-08-29 DIAGNOSIS — L989 Disorder of the skin and subcutaneous tissue, unspecified: Secondary | ICD-10-CM

## 2022-08-29 DIAGNOSIS — Q828 Other specified congenital malformations of skin: Secondary | ICD-10-CM | POA: Diagnosis not present

## 2022-08-29 NOTE — Progress Notes (Addendum)
  Subjective:  Patient ID: Fernando Kelley, male    DOB: Nov 07, 1975,   MRN: [id number]  Chief Complaint  Patient presents with   Callouses    Corn of left foot/painful.     47 y.o. male presents for concern of a lesion on the bottom of his left foot. Relates he has dealt with this corn area for several years. In 2016 he had it removed and hoping to get some relief today. Referred here by Dr. Darene Lamer. . Denies any other pedal complaints. Denies n/v/f/c.   History reviewed. No pertinent past medical history.  Objective:  Physical Exam: Vascular: DP/PT pulses 2/4 bilateral. CFT <3 seconds. Normal hair growth on digits. No edema.  Skin. No lacerations or abrasions bilateral feet. Hyperkeratotic cored lesion noted to plantar third metatarsal head.  Musculoskeletal: MMT 5/5 bilateral lower extremities in DF, PF, Inversion and Eversion. Deceased ROM in DF of ankle joint.  Neurological: Sensation intact to light touch.   Assessment:   1. Acquired plantar porokeratosis      Plan:  Patient was evaluated and treated and all questions answered. -Discussed corns and porokeratosis  with patient and treatment options.  -Hyperkeratotic tissue was debrided with chisel without incident.  -Applied salycylic acid treatment to area with dressing. Advised to remove bandaging tomorrow.  -Encouraged daily moisturizing -Discussed use of pumice stone -Advised good supportive shoes and inserts -Patient to return to office as needed or sooner if condition worsens.   Lorenda Peck, DPM

## 2022-09-05 ENCOUNTER — Ambulatory Visit: Payer: 59

## 2022-09-06 MED ORDER — VITAMIN D (ERGOCALCIFEROL) 1.25 MG (50000 UNIT) PO CAPS: 50000.0000 [IU] | ORAL_CAPSULE | ORAL | 0 refills | Status: DC

## 2022-09-06 NOTE — Addendum Note (Signed)
Addended by: Silverio Decamp on: 09/06/2022 08:44 PM   Modules accepted: Orders

## 2022-09-08 IMAGING — DX DG CHEST 2V
2 series · 3 of 3 positions shown · non-contrast
Comparison: July 11, 2020.

CLINICAL DATA: Chest pain.

EXAM:
CHEST - 2 VIEW

[chest pa]
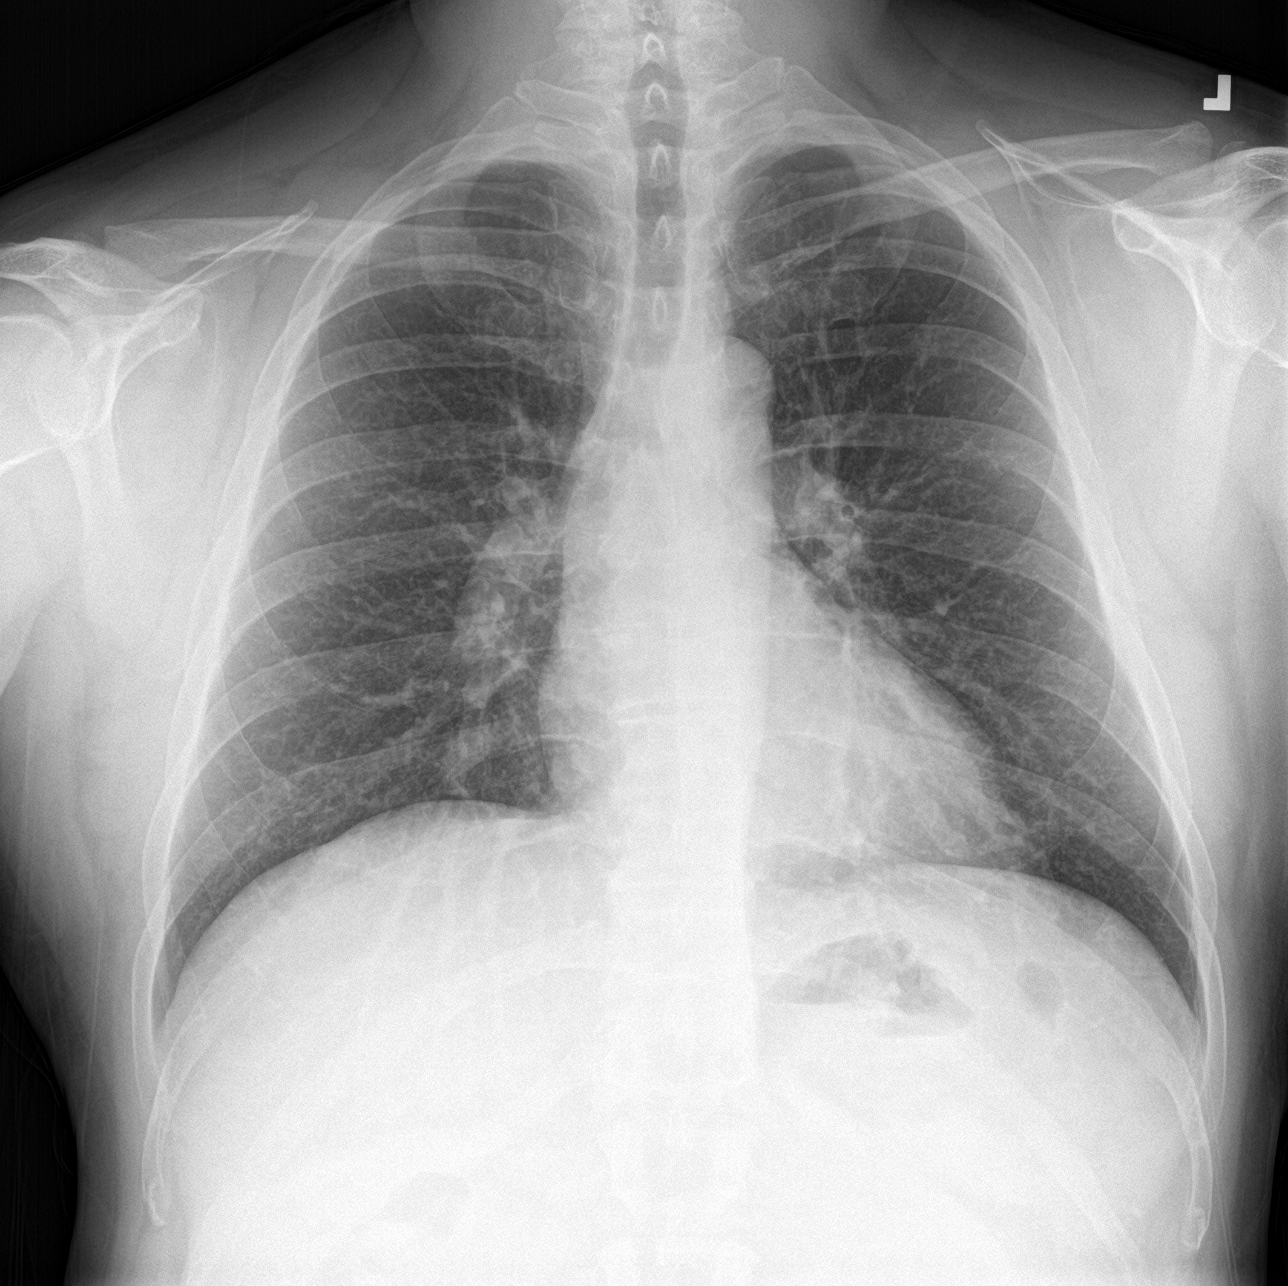

[Series 2: chest lat · 0.14mm/px · 2 of 2 slices shown]
[im 1/2]
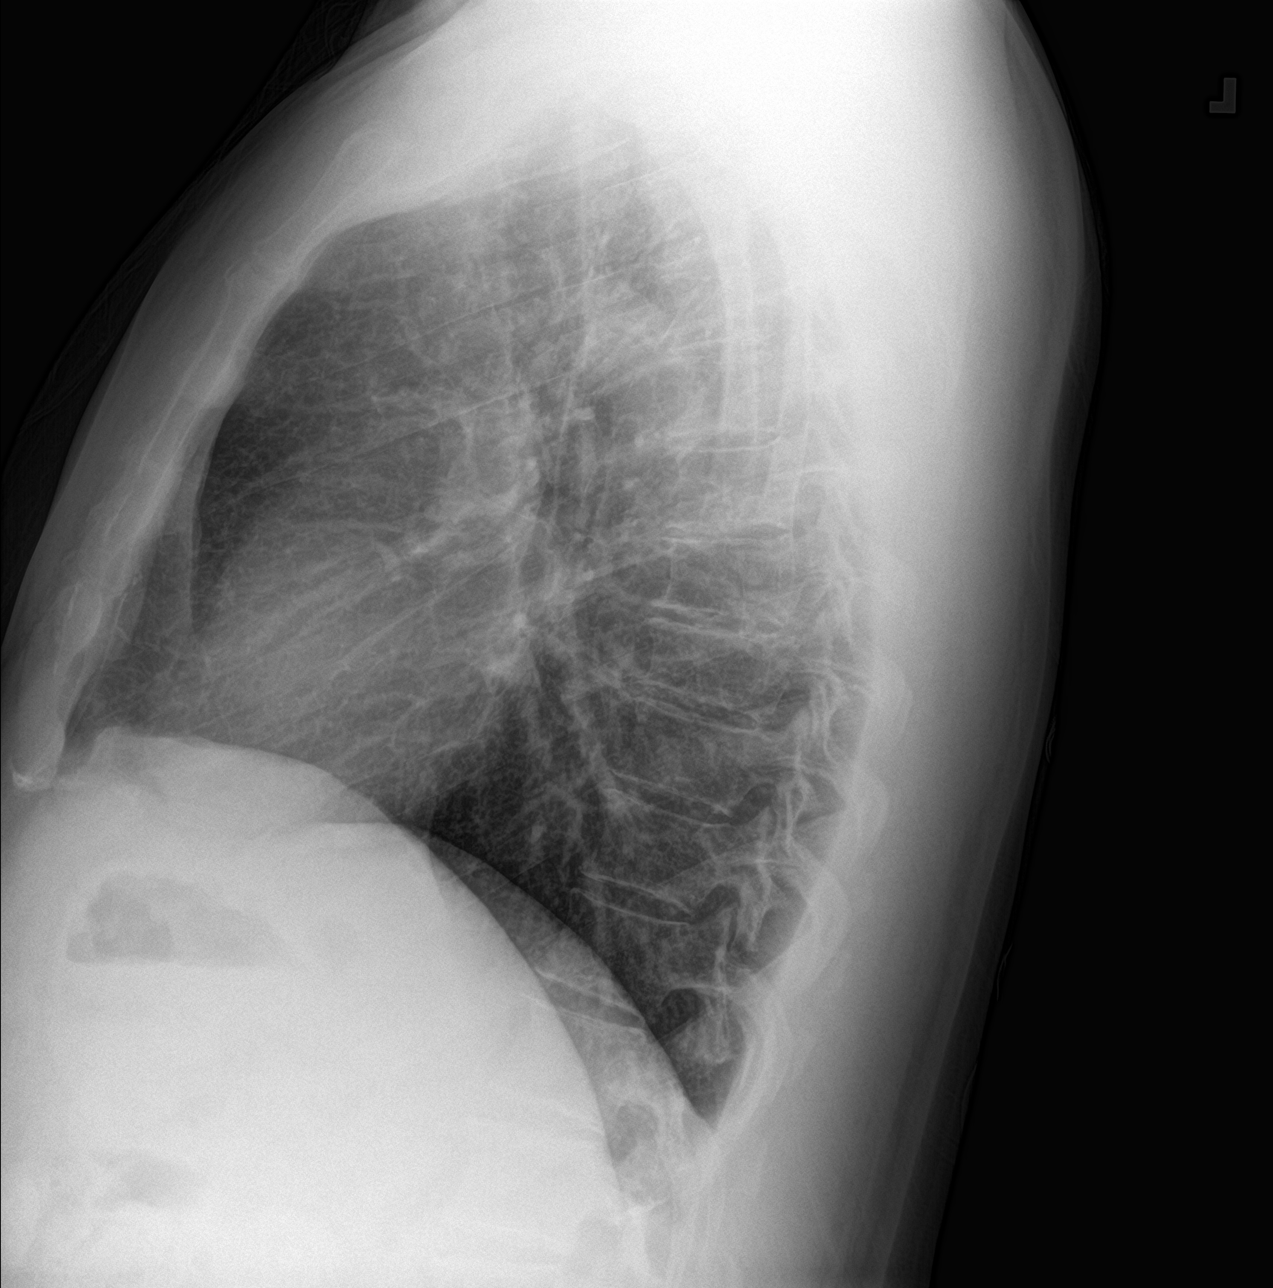
[im 2/2]
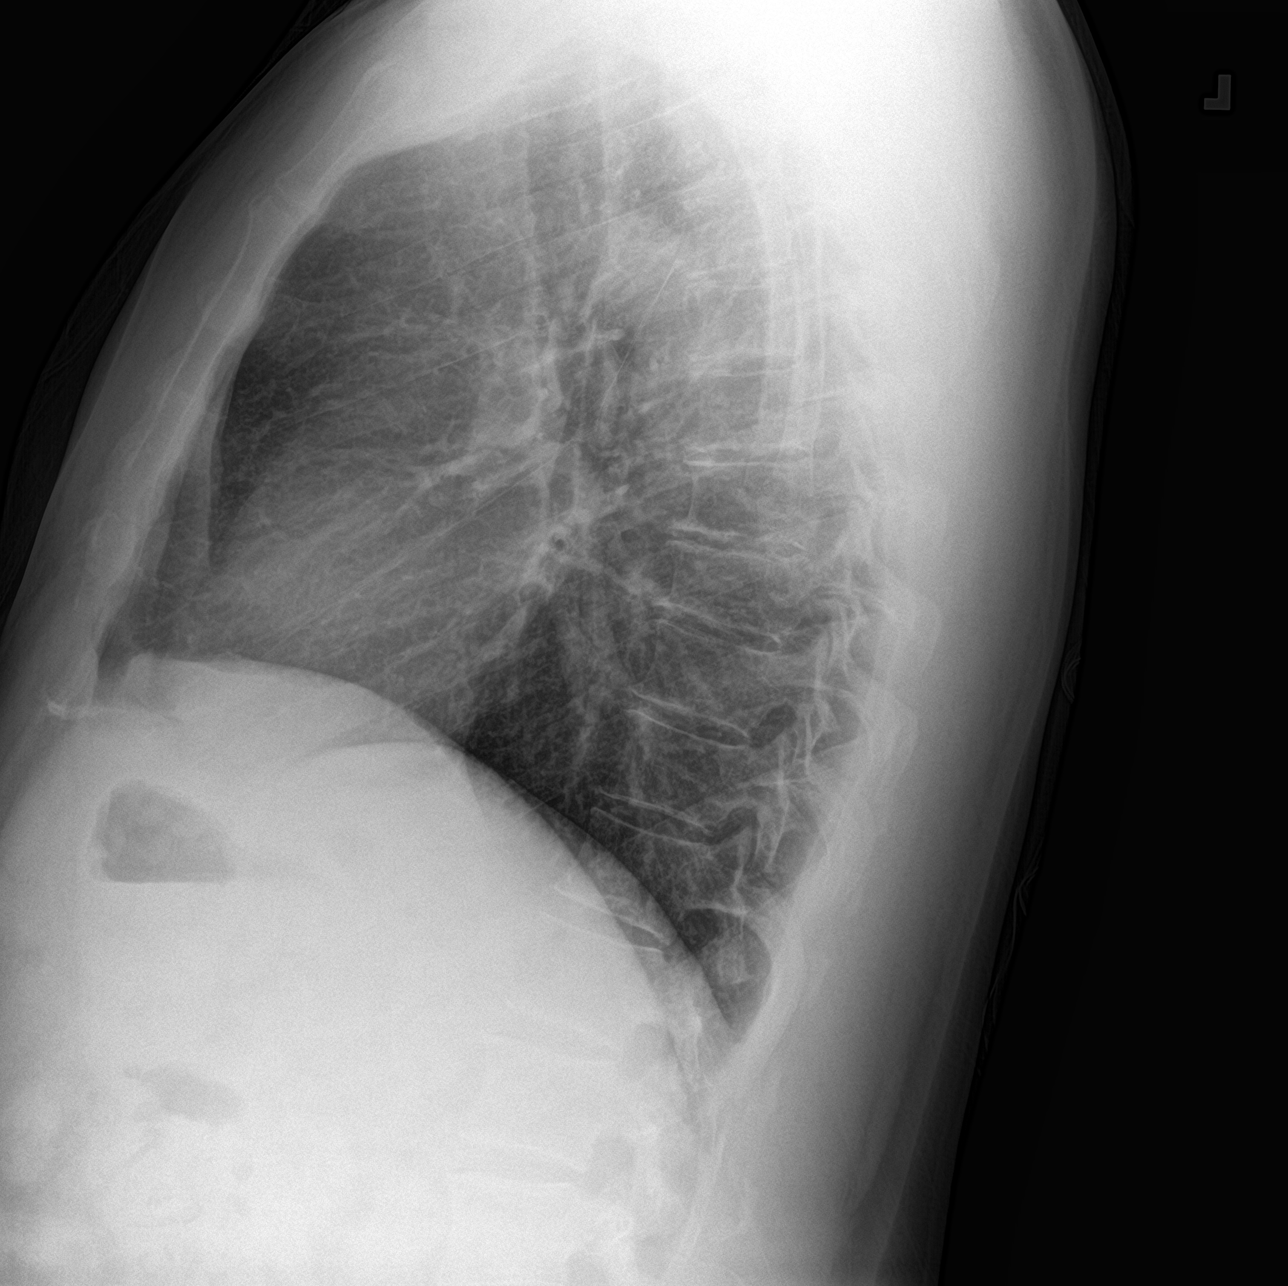

[3 of 3 positions shown; findings below may reference images not displayed]

FINDINGS: The heart size and mediastinal contours are within normal limits.
Both lungs are clear. The visualized skeletal structures are
unremarkable.
IMPRESSION: No active cardiopulmonary disease.

## 2022-09-10 LAB — COMPLETE METABOLIC PANEL WITH GFR
AG Ratio: 2.2 (calc) (ref 1.0–2.5)
ALT: 39 U/L (ref 9–46)
AST: 25 U/L (ref 10–40)
Albumin: 4.6 g/dL (ref 3.6–5.1)
Alkaline phosphatase (APISO): 71 U/L (ref 36–130)
BUN: 12 mg/dL (ref 7–25)
CO2: 20 mmol/L (ref 20–32)
Calcium: 9 mg/dL (ref 8.6–10.3)
Chloride: 106 mmol/L (ref 98–110)
Creat: 1.06 mg/dL (ref 0.60–1.29)
Globulin: 2.1 g/dL (calc) (ref 1.9–3.7)
Glucose, Bld: 87 mg/dL (ref 65–99)
Potassium: 4.2 mmol/L (ref 3.5–5.3)
Sodium: 137 mmol/L (ref 135–146)
Total Bilirubin: 0.5 mg/dL (ref 0.2–1.2)
Total Protein: 6.7 g/dL (ref 6.1–8.1)
eGFR: 88 mL/min/{1.73_m2} (ref >=60)

## 2022-09-10 LAB — CBC
HCT: 40.8 % (ref 38.5–50.0)
Hemoglobin: 14.4 g/dL (ref 13.2–17.1)
MCH: 32.1 pg (ref 27.0–33.0)
MCHC: 35.3 g/dL (ref 32.0–36.0)
MCV: 90.9 fL (ref 80.0–100.0)
MPV: 10.3 fL (ref 7.5–12.5)
Platelets: 277 10*3/uL (ref 140–400)
RBC: 4.49 10*6/uL (ref 4.20–5.80)
RDW: 12 % (ref 11.0–15.0)
WBC: 6.4 10*3/uL (ref 3.8–10.8)

## 2022-09-10 LAB — TSH: TSH: 1.14 mIU/L (ref 0.40–4.50)

## 2022-09-10 LAB — LIPID PANEL
Cholesterol: 150 mg/dL (ref <200)
HDL: 36 mg/dL — ABNORMAL LOW (ref >=40)
LDL Cholesterol (Calc): 99 mg/dL (calc)
Non-HDL Cholesterol (Calc): 114 mg/dL (calc) (ref <130)
Total CHOL/HDL Ratio: 4.2 (calc) (ref <5.0)
Triglycerides: 60 mg/dL (ref <150)

## 2022-09-10 LAB — TESTOSTERONE, FREE & TOTAL
Free Testosterone: 54.1 pg/mL (ref 35.0–155.0)
Testosterone, Total, LC-MS-MS: 431 ng/dL (ref 250–1100)

## 2022-09-10 LAB — B12 AND FOLATE PANEL
Folate: >24 ng/mL
Vitamin B-12: 439 pg/mL (ref 200–1100)

## 2022-09-10 LAB — HEMOGLOBIN A1C
Hgb A1c MFr Bld: 5.9 % of total Hgb — ABNORMAL HIGH (ref <5.7)
Mean Plasma Glucose: 123 mg/dL
eAG (mmol/L): 6.8 mmol/L

## 2022-09-10 LAB — VITAMIN D 25 HYDROXY (VIT D DEFICIENCY, FRACTURES): Vit D, 25-Hydroxy: 26 ng/mL — ABNORMAL LOW (ref 30–100)

## 2022-09-19 ENCOUNTER — Encounter (HOSPITAL_BASED_OUTPATIENT_CLINIC_OR_DEPARTMENT_OTHER): Payer: 59 | Admitting: Internal Medicine

## 2022-10-30 ENCOUNTER — Encounter: Payer: Self-pay | Admitting: Podiatry

## 2022-10-30 ENCOUNTER — Ambulatory Visit (INDEPENDENT_AMBULATORY_CARE_PROVIDER_SITE_OTHER): Payer: 59

## 2022-10-30 ENCOUNTER — Ambulatory Visit (INDEPENDENT_AMBULATORY_CARE_PROVIDER_SITE_OTHER): Payer: 59 | Admitting: Podiatry

## 2022-10-30 DIAGNOSIS — M79672 Pain in left foot: Secondary | ICD-10-CM

## 2022-10-30 DIAGNOSIS — B070 Plantar wart: Secondary | ICD-10-CM | POA: Diagnosis not present

## 2022-10-30 NOTE — Progress Notes (Signed)
  Subjective:  Patient ID: Fernando Kelley, male    DOB: [birth year],   MRN: [id number]  Chief Complaint  Patient presents with   Callouses     Corn of left foot/painful.       47 y.o. male presents for concern of a lesion on the bottom of his left foot. Relates he has dealt with this area before and has returned.  . Denies any other pedal complaints. Denies n/v/f/c.   No past medical history on file.  Objective:  Physical Exam: Vascular: DP/PT pulses 2/4 bilateral. CFT <3 seconds. Normal hair growth on digits. No edema.  Skin. No lacerations or abrasions bilateral feet. Hyperkeratotic cored lesion noted to plantar third metatarsal head. There is disruption of skin lines and appears to be some petechia and capillary budding noted.  Musculoskeletal: MMT 5/5 bilateral lower extremities in DF, PF, Inversion and Eversion. Deceased ROM in DF of ankle joint.  Neurological: Sensation intact to light touch.   Assessment:   1. Left foot pain      Plan:  Patient was evaluated and treated and all questions answered. -Discussed plantar wart vs benign lesion of the foot with patient and treatment options.  -Hyperkeratotic tissue was debrided with chisel without incident.  -Applied salycylic acid treatment to area with dressing. Advised to remove bandaging tomorrow.  -Advised use of OTC Compound W.  -Encouraged daily moisturizing -Discussed use of pumice stone -Advised good supportive shoes and inserts -Patient to return to office as needed or sooner if condition worsens.   Lorenda Peck, DPM

## 2022-10-31 ENCOUNTER — Encounter: Payer: Self-pay | Admitting: Podiatry

## 2023-01-21 ENCOUNTER — Other Ambulatory Visit: Payer: Self-pay | Admitting: Sports Medicine

## 2023-01-21 DIAGNOSIS — E782 Mixed hyperlipidemia: Secondary | ICD-10-CM

## 2023-01-21 MED ORDER — ATORVASTATIN CALCIUM 10 MG PO TABS: 10.0000 mg | ORAL_TABLET | Freq: Every day | ORAL | 3 refills | Status: DC

## 2023-06-05 ENCOUNTER — Ambulatory Visit: Payer: 59 | Admitting: Podiatry

## 2023-06-11 ENCOUNTER — Ambulatory Visit (INDEPENDENT_AMBULATORY_CARE_PROVIDER_SITE_OTHER): Payer: 59 | Admitting: Podiatry

## 2023-06-11 ENCOUNTER — Encounter: Payer: Self-pay | Admitting: Podiatry

## 2023-06-11 DIAGNOSIS — B070 Plantar wart: Secondary | ICD-10-CM

## 2023-06-11 NOTE — Progress Notes (Signed)
  Subjective:  Patient ID: Fernando Kelley, male    DOB: 1975/10/16,   MRN: [id number]  Chief Complaint  Patient presents with   Callouses    LEFT FOOT CALLUS    47 y.o. male presents for concern of a lesion on the bottom of his left foot. Relates the area has returned and hoping to have taken care of again.  Denies any other pedal complaints. Denies n/v/f/c.   No past medical history on file.  Objective:  Physical Exam: Vascular: DP/PT pulses 2/4 bilateral. CFT <3 seconds. Normal hair growth on digits. No edema.  Skin. No lacerations or abrasions bilateral feet. Hyperkeratotic cored lesion noted to plantar third metatarsal head. There is disruption of skin lines and appears to be some petechia and capillary budding noted.  Musculoskeletal: MMT 5/5 bilateral lower extremities in DF, PF, Inversion and Eversion. Deceased ROM in DF of ankle joint.  Neurological: Sensation intact to light touch.   Assessment:   1. Plantar wart, left foot      Plan:  Patient was evaluated and treated and all questions answered. -Discussed plantar wart vs benign lesion of the foot with patient and treatment options.  -Hyperkeratotic tissue was debrided with chisel without incident.  -Applied salycylic acid treatment to area with dressing. Advised to remove bandaging tomorrow.  -Advised use of OTC Compound W.  -Encouraged daily moisturizing -Discussed use of pumice stone -Advised good supportive shoes and inserts -Patient to return to office as needed or sooner if condition worsens.   Louann Sjogren, DPM

## 2023-08-28 ENCOUNTER — Encounter: Payer: 59 | Admitting: Sports Medicine

## 2023-09-03 ENCOUNTER — Ambulatory Visit (INDEPENDENT_AMBULATORY_CARE_PROVIDER_SITE_OTHER): Payer: 59 | Admitting: Sports Medicine

## 2023-09-03 ENCOUNTER — Ambulatory Visit: Payer: 59

## 2023-09-03 ENCOUNTER — Encounter: Payer: Self-pay | Admitting: Sports Medicine

## 2023-09-03 VITALS — BP 130/88 | HR 75 | Ht 69.0 in | Wt 207.0 lb

## 2023-09-03 DIAGNOSIS — M5412 Radiculopathy, cervical region: Secondary | ICD-10-CM

## 2023-09-03 DIAGNOSIS — Z1211 Encounter for screening for malignant neoplasm of colon: Secondary | ICD-10-CM

## 2023-09-03 DIAGNOSIS — E559 Vitamin D deficiency, unspecified: Secondary | ICD-10-CM

## 2023-09-03 DIAGNOSIS — M2550 Pain in unspecified joint: Secondary | ICD-10-CM

## 2023-09-03 DIAGNOSIS — M62838 Other muscle spasm: Secondary | ICD-10-CM

## 2023-09-03 DIAGNOSIS — E782 Mixed hyperlipidemia: Secondary | ICD-10-CM

## 2023-09-03 DIAGNOSIS — Z0000 Encounter for general adult medical examination without abnormal findings: Secondary | ICD-10-CM

## 2023-09-03 DIAGNOSIS — F17200 Nicotine dependence, unspecified, uncomplicated: Secondary | ICD-10-CM

## 2023-09-03 MED ORDER — VARENICLINE TARTRATE (STARTER) 0.5 MG X 11 & 1 MG X 42 PO TBPK: ORAL_TABLET | ORAL | 0 refills | Status: AC

## 2023-09-03 MED ORDER — VARENICLINE TARTRATE 1 MG PO TABS: 1.0000 mg | ORAL_TABLET | Freq: Two times a day (BID) | ORAL | 3 refills | Status: AC

## 2023-09-03 NOTE — Assessment & Plan Note (Addendum)
Fasting annual as above, checking routine labs for Patient prefers colonoscopy so we will set him up with gastroenterology. Has smoked a pack a day since age 48, this puts him at about 35 pack years so he will be a candidate for lung cancer screening at 65. We will defer flu shot to next season.

## 2023-09-03 NOTE — Progress Notes (Addendum)
Subjective:    CC: Annual Physical Exam  HPI:  This patient is here for their annual physical  I reviewed the past medical history, family history, social history, surgical history, and allergies today and no changes were needed.  Please see the problem list section below in epic for further details.  Past Medical History: No past medical history on file. Past Surgical History: No past surgical history on file. Social History: Social History   Socioeconomic History   Marital status: Married    Spouse name: Not on file   Number of children: Not on file   Years of education: Not on file   Highest education level: Not on file  Occupational History   Not on file  Tobacco Use   Smoking status: Every Day    Current packs/day: 0.50    Average packs/day: 0.5 packs/day for 25.0 years (12.5 ttl pk-yrs)    Types: Cigarettes   Smokeless tobacco: Never  Substance and Sexual Activity   Alcohol use: Yes   Drug use: No   Sexual activity: Not on file  Other Topics Concern   Not on file  Social History Narrative   Not on file   Social Drivers of Health   Financial Resource Strain: Low Risk  (09/22/2022)   Received from [facility], [facility]   Overall Financial Resource Strain (CARDIA)    Difficulty of Paying Living Expenses: Not hard at all  Food Insecurity: No Food Insecurity (09/22/2022)   [facility], [facility]   Hunger Vital Sign    Worried About Running Out of Food in the Last Year: Never true    Ran Out of Food in the Last Year: Never true  Transportation Needs: No Transportation Needs (09/22/2022)   Received from [facility], [facility]   PRAPARE - Transportation    Lack of Transportation (Medical): No    Lack of Transportation (Non-Medical): No  Physical Activity: Not on file  Stress: Not on file  Social Connections: Unknown (12/17/2021)   Received from [facility], [facility]   Social Network    Social Network: Not on file    Family History: Family History  Problem Relation Age of Onset   Cancer Mother    Heart attack Father    Allergies: Allergies  Allergen Reactions   Sulfa Antibiotics Rash   Medications: See med rec.  Review of Systems: No headache, visual changes, nausea, vomiting, diarrhea, constipation, dizziness, abdominal pain, skin rash, fevers, chills, night sweats, swollen lymph nodes, weight loss, chest pain, body aches, joint swelling, muscle aches, shortness of breath, mood changes, visual or auditory hallucinations.  Objective:    General: Well Developed, well nourished, and in no acute distress.  Neuro: Alert and oriented x3, extra-ocular muscles intact, sensation grossly intact. Cranial nerves II through XII are intact, motor, sensory, and coordinative functions are all intact. HEENT: Normocephalic, atraumatic, pupils equal round reactive to light, neck supple, no masses, no lymphadenopathy, thyroid nonpalpable. Oropharynx, nasopharynx, external ear canals are unremarkable. Skin: Warm and dry, no rashes noted.  Cardiac: Regular rate and rhythm, no murmurs rubs or gallops.  Respiratory: Clear to auscultation bilaterally. Not using accessory muscles, speaking in full sentences.  Abdominal: Soft, nontender, nondistended, positive bowel sounds, no masses, no organomegaly.  Musculoskeletal: Shoulder, elbow, wrist, hip, knee, ankle stable, and with full range of motion.  Impression and Recommendations:    The patient was counselled, risk factors were discussed, anticipatory guidance given.  Annual physical exam Fasting annual as  above, checking routine labs for Patient prefers colonoscopy so we will set him up with gastroenterology. Has smoked a pack a day since age 27, this puts him at about 35 pack years so he will be a candidate for lung cancer screening at 22. We will defer flu shot to next season.   Cervical paraspinal muscle spasm Neck pain with some radiation to the left upper  chest and down the left arm, this resolved about 4 years ago with conservative treatment, we will get updated x-rays and add cervical spine conditioning.  Hyperlipidemia, mixed [place] is having some achiness in both thighs anteriorly. We will have him do some quadricep stretches, if this does not help over the next couple of weeks he will discontinue his atorvastatin. If discontinuing atorvastatin helps his pain then we will switch to Crestor.  Smoker Approximate 1 pack a day since age 50, 45 pack years. We will start lung cancer screening at 51, adding Chantix.   ____________________________________________ Ihor Austin. Benjamin Stain, M.D., ABFM., CAQSM., AME. Primary Care and Sports Medicine [facility]  Adjunct Professor of Family Medicine  [place] [employer] [facility] [employer]  Restaurant manager, fast food

## 2023-09-03 NOTE — Assessment & Plan Note (Signed)
Neck pain with some radiation to the left upper chest and down the left arm, this resolved about 4 years ago with conservative treatment, we will get updated x-rays and add cervical spine conditioning.

## 2023-09-03 NOTE — Assessment & Plan Note (Signed)
Approximate 1 pack a day since age 48, 45 pack years. We will start lung cancer screening at 51, adding Chantix.

## 2023-09-03 NOTE — Patient Instructions (Addendum)
Do the quad conditioning for couple of weeks and if persistent thigh pain discontinue atorvastatin and let me know after 2 more weeks if the aches get better.

## 2023-09-03 NOTE — Assessment & Plan Note (Signed)
[place] is having some achiness in both thighs anteriorly. We will have him do some quadricep stretches, if this does not help over the next couple of weeks he will discontinue his atorvastatin. If discontinuing atorvastatin helps his pain then we will switch to Crestor.

## 2023-09-03 NOTE — Addendum Note (Signed)
Addended by: Monica Becton on: 09/03/2023 09:58 AM   Modules accepted: Orders

## 2023-09-04 ENCOUNTER — Encounter: Payer: Self-pay | Admitting: Sports Medicine

## 2023-09-04 LAB — CBC
Hematocrit: 45 % (ref 37.5–51.0)
Hemoglobin: 15.7 g/dL (ref 13.0–17.7)
MCH: 32.2 pg (ref 26.6–33.0)
MCHC: 34.9 g/dL (ref 31.5–35.7)
MCV: 92 fL (ref 79–97)
Platelets: 302 10*3/uL (ref 150–450)
RBC: 4.88 x10E6/uL (ref 4.14–5.80)
RDW: 12.2 % (ref 11.6–15.4)
WBC: 7.7 10*3/uL (ref 3.4–10.8)

## 2023-09-04 LAB — COMPREHENSIVE METABOLIC PANEL
ALT: 33 [IU]/L (ref 0–44)
AST: 26 [IU]/L (ref 0–40)
Albumin: 4.7 g/dL (ref 4.1–5.1)
Alkaline Phosphatase: 78 [IU]/L (ref 44–121)
BUN/Creatinine Ratio: 12 (ref 9–20)
BUN: 12 mg/dL (ref 6–24)
Bilirubin Total: 0.6 mg/dL (ref 0.0–1.2)
CO2: 19 mmol/L — ABNORMAL LOW (ref 20–29)
Calcium: 9.5 mg/dL (ref 8.7–10.2)
Chloride: 101 mmol/L (ref 96–106)
Creatinine, Ser: 1.03 mg/dL (ref 0.76–1.27)
Globulin, Total: 1.5 g/dL (ref 1.5–4.5)
Glucose: 93 mg/dL (ref 70–99)
Potassium: 4.6 mmol/L (ref 3.5–5.2)
Sodium: 139 mmol/L (ref 134–144)
Total Protein: 6.2 g/dL (ref 6.0–8.5)
eGFR: 91 mL/min/{1.73_m2} (ref >59)

## 2023-09-04 LAB — LIPID PANEL
Chol/HDL Ratio: 4.5 {ratio} (ref 0.0–5.0)
Cholesterol, Total: 156 mg/dL (ref 100–199)
HDL: 35 mg/dL — ABNORMAL LOW (ref >39)
LDL Chol Calc (NIH): 104 mg/dL — ABNORMAL HIGH (ref 0–99)
Triglycerides: 92 mg/dL (ref 0–149)
VLDL Cholesterol Cal: 17 mg/dL (ref 5–40)

## 2023-09-04 LAB — CK: Total CK: 205 U/L (ref 49–439)

## 2023-09-04 LAB — HEMOGLOBIN A1C
Est. average glucose Bld gHb Est-mCnc: 120 mg/dL
Hgb A1c MFr Bld: 5.8 % — ABNORMAL HIGH (ref 4.8–5.6)

## 2023-09-04 LAB — TSH: TSH: 1.16 u[IU]/mL (ref 0.450–4.500)

## 2023-09-04 LAB — VITAMIN D 25 HYDROXY (VIT D DEFICIENCY, FRACTURES): Vit D, 25-Hydroxy: 38.1 ng/mL (ref 30.0–100.0)

## 2023-09-07 ENCOUNTER — Encounter: Payer: Self-pay | Admitting: Sports Medicine

## 2023-09-11 ENCOUNTER — Encounter: Payer: Self-pay | Admitting: Pediatrics

## 2023-09-11 ENCOUNTER — Telehealth: Payer: Self-pay

## 2023-09-11 NOTE — Telephone Encounter (Signed)
Copied from CRM [id number]. Topic: Clinical - Lab/Test Results >> Sep 10, 2023 12:41 PM Carlatta H wrote: Reason for CRM: Please call patient about xray results//

## 2023-10-05 ENCOUNTER — Ambulatory Visit (AMBULATORY_SURGERY_CENTER): Payer: 59 | Admitting: *Deleted

## 2023-10-05 VITALS — Ht 70.0 in | Wt 210.0 lb

## 2023-10-05 DIAGNOSIS — Z1211 Encounter for screening for malignant neoplasm of colon: Secondary | ICD-10-CM

## 2023-10-05 MED ORDER — SUFLAVE 178.7 G PO SOLR: 1.0000 | Freq: Once | ORAL | 0 refills | Status: DC

## 2023-10-05 MED ORDER — SUFLAVE 178.7 G PO SOLR: 1.0000 | Freq: Once | ORAL | 0 refills | Status: AC

## 2023-10-05 NOTE — Progress Notes (Signed)
 Pt's name and DOB verified at the beginning of the pre-visit wit 2 identifiers  Pt denies any difficulty with ambulating,sitting, laying down or rolling side to side  Pt has no issues with ambulation   Pt has no issues moving head neck or swallowing  No egg or soy allergy known to patient   No issues known to pt with past sedation with any surgeries or procedures  Patient denies ever being intubated  No FH of Malignant Hyperthermia  Pt is not on diet pills or shots  Pt is not on home 02   Pt is not on blood thinners   Pt denies issues with constipation   Pt is not on dialysis  Pt denise any abnormal heart rhythms   Pt denies any upcoming cardiac testing  Patient's chart reviewed by Cathlyn Parsons CNRA prior to pre-visit and patient appropriate for the [facility].  Pre-visit completed and red dot placed by patient's name on their procedure day (on provider's schedule).    Chart not reviewed by CRNA prior to [facility]  Visit by phon  Pt states weight is 210 lb  IInstructions reviewed. Pt given [facility], [facility] main # and MD on call # prior to instructions.  Pt states understanding of instructions. Instructed to review again prior to procedure. Pt states they will.   Informed pt that they will receive a text or  call from [facility] regarding there prep med.

## 2023-10-16 ENCOUNTER — Encounter: Payer: Self-pay | Admitting: Pediatrics

## 2023-10-22 NOTE — Progress Notes (Unsigned)
 [facility] History and Physical   Primary Care Physician:  Monica Becton, MD   Reason for Procedure:  Colon cancer screening  Plan:    Screening colonoscopy   HPI: Fernando Kelley is a 48 y.o. male undergoing screening colonoscopy for colon cancer screening.  No previous colonoscopy.  No family history of colorectal cancer or colon polyps.  Patient denies current symptoms of change in bowel habits or rectal bleeding.   Past Medical History:  Diagnosis Date   ADHD    Glaucoma    Heart murmur    Hyperlipidemia     Past Surgical History:  Procedure Laterality Date   CYST REMOVAL HAND     R index finger surgery      Prior to Admission medications   Medication Sig Start Date End Date Taking? Authorizing Provider  atorvastatin (LIPITOR) 10 MG tablet Take 1 tablet (10 mg total) by mouth daily. 01/21/23   Monica Becton, MD  clindamycin (CLINDAGEL) 1 % gel Apply topically 2 (two) times daily. 09/21/23   [provider]  tretinoin (RETIN-A) 0.025 % cream Apply topically. 09/21/23   [provider]  varenicline (CHANTIX) 1 MG tablet Take 1 tablet (1 mg total) by mouth 2 (two) times daily. Patient not taking: Reported on 10/05/2023 09/03/23   Monica Becton, MD  Varenicline Tartrate, Starter, (CHANTIX STARTING MONTH PAK) 0.5 MG X 11 & 1 MG X 42 TBPK Take one 0.5 mg tablet by mouth once daily for 3 days, then increase to one 0.5 mg tablet twice daily for 4 days, then increase to one 1 mg tablet twice daily. Patient not taking: Reported on 10/05/2023 09/03/23   Monica Becton, MD  Vitamin D, Ergocalciferol, (DRISDOL) 1.25 MG (50000 UNIT) CAPS capsule Take 1 capsule (50,000 Units total) by mouth every 7 (seven) days. Take for 8 total doses(weeks) 09/06/22   Monica Becton, MD    Current Outpatient Medications  Medication Sig Dispense Refill   atorvastatin (LIPITOR) 10 MG tablet Take 1 tablet (10 mg total) by mouth daily. 90  tablet 3   clindamycin (CLINDAGEL) 1 % gel Apply topically 2 (two) times daily.     tretinoin (RETIN-A) 0.025 % cream Apply topically.     varenicline (CHANTIX) 1 MG tablet Take 1 tablet (1 mg total) by mouth 2 (two) times daily. (Patient not taking: Reported on 10/05/2023) 60 tablet 3   Varenicline Tartrate, Starter, (CHANTIX STARTING MONTH PAK) 0.5 MG X 11 & 1 MG X 42 TBPK Take one 0.5 mg tablet by mouth once daily for 3 days, then increase to one 0.5 mg tablet twice daily for 4 days, then increase to one 1 mg tablet twice daily. (Patient not taking: Reported on 10/05/2023) 1 each 0   Vitamin D, Ergocalciferol, (DRISDOL) 1.25 MG (50000 UNIT) CAPS capsule Take 1 capsule (50,000 Units total) by mouth every 7 (seven) days. Take for 8 total doses(weeks) 8 capsule 0   No current facility-administered medications for this visit.    Allergies as of 10/23/2023 - Review Complete 10/05/2023  Allergen Reaction Noted   Sulfa antibiotics Rash 09/15/2022    Family History  Problem Relation Age of Onset   Cancer Mother    Heart attack Father    Colon polyps Neg Hx    Colon cancer Neg Hx    Esophageal cancer Neg Hx    Rectal cancer Neg Hx    Stomach cancer Neg Hx     Social History  Socioeconomic History   Marital status: Married    Spouse name: Not on file   Number of children: Not on file   Years of education: Not on file   Highest education level: Not on file  Occupational History   Not on file  Tobacco Use   Smoking status: Every Day    Current packs/day: 0.50    Average packs/day: 0.5 packs/day for 25.0 years (12.5 ttl pk-yrs)    Types: Cigarettes   Smokeless tobacco: Never  Substance and Sexual Activity   Alcohol use: Yes   Drug use: No   Sexual activity: Not on file  Other Topics Concern   Not on file  Social History Narrative   Not on file   Social Drivers of Health   Financial Resource Strain: Low Risk  (09/22/2022)   [facility]    Overall Financial Resource Strain (CARDIA)    Difficulty of Paying Living Expenses: Not hard at all  Food Insecurity: No Food Insecurity (09/22/2022)   Received from [facility], [facility]   Hunger Vital Sign    Worried About Running Out of Food in the Last Year: Never true    Ran Out of Food in the Last Year: Never true  Transportation Needs: No Transportation Needs (09/22/2022)   Received from [facility], [facility]   PRAPARE - Transportation    Lack of Transportation (Medical): No    Lack of Transportation (Non-Medical): No  Physical Activity: Not on file  Stress: Not on file  Social Connections: Unknown (12/17/2021)   Received from [facility], [facility]   Social Network    Social Network: Not on file  Intimate Partner Violence: Unknown (11/21/2021)   Received from [facility], [facility]   HITS    Physically Hurt: Not on file    Insult or Talk Down To: Not on file    Threaten Physical Harm: Not on file    Scream or Curse: Not on file    Review of Systems:  All other review of systems negative except as mentioned in the HPI.  Physical Exam: Vital signs There were no vitals taken for this visit.  General:   Alert,  Well-developed, well-nourished, pleasant and cooperative in NAD Airway:  Mallampati  Lungs:  Clear throughout to auscultation.   Heart:  Regular rate and rhythm; no murmurs, clicks, rubs,  or gallops. Abdomen:  Soft, nontender and nondistended. Normal bowel sounds.   Neuro/Psych:  Normal mood and affect. A and O x 3  Maren Beach, MD [facility]

## 2023-10-23 ENCOUNTER — Encounter: Payer: Self-pay | Admitting: Pediatrics

## 2023-10-23 ENCOUNTER — Ambulatory Visit: Payer: 59 | Admitting: Pediatrics

## 2023-10-23 VITALS — BP 139/82 | HR 66 | Temp 98.6°F | Resp 12 | Ht 70.0 in | Wt 210.0 lb

## 2023-10-23 DIAGNOSIS — K648 Other hemorrhoids: Secondary | ICD-10-CM | POA: Diagnosis not present

## 2023-10-23 DIAGNOSIS — D124 Benign neoplasm of descending colon: Secondary | ICD-10-CM

## 2023-10-23 DIAGNOSIS — D122 Benign neoplasm of ascending colon: Secondary | ICD-10-CM

## 2023-10-23 DIAGNOSIS — Z1211 Encounter for screening for malignant neoplasm of colon: Secondary | ICD-10-CM

## 2023-10-23 MED ORDER — SODIUM CHLORIDE 0.9 % IV SOLN: 500.0000 mL | INTRAVENOUS | Status: DC

## 2023-10-23 NOTE — Progress Notes (Signed)
 Called to room to assist during endoscopic procedure.  Patient ID and intended procedure confirmed with present staff. Received instructions for my participation in the procedure from the performing physician.

## 2023-10-23 NOTE — Op Note (Signed)
 [facility] Patient Name: Fernando Kelley Procedure Date: 10/23/2023 11:07 AM MRN: [id number] Endoscopist: Maren Beach , MD, [id number] Age: 48 Referring MD:  Date of Birth: [birth year] Gender: Male Account #: [id number] Procedure:                Colonoscopy Indications:              Screening for colorectal malignant neoplasm, This                            is the patient's first colonoscopy Medicines:                Monitored Anesthesia Care Procedure:                Pre-Anesthesia Assessment:                           - Prior to the procedure, a History and Physical                            was performed, and patient medications and                            allergies were reviewed. The patient's tolerance of                            previous anesthesia was also reviewed. The risks                            and benefits of the procedure and the sedation                            options and risks were discussed with the patient.                            All questions were answered, and informed consent                            was obtained. Prior Anticoagulants: The patient has                            taken no anticoagulant or antiplatelet agents. ASA                            Grade Assessment: III - A patient with severe                            systemic disease. After reviewing the risks and                            benefits, the patient was deemed in satisfactory                            condition to undergo the procedure.  After obtaining informed consent, the colonoscope                            was passed under direct vision. Throughout the                            procedure, the patient's blood pressure, pulse, and                            oxygen saturations were monitored continuously. The                            CF [id number] #[id number] was introduced through the anus                            and advanced to the  cecum, identified by                            appendiceal orifice and ileocecal valve. The                            colonoscopy was performed without difficulty. The                            patient tolerated the procedure well. The quality                            of the bowel preparation was good. The ileocecal                            valve, appendiceal orifice, and rectum were                            photographed. Scope In: 11:17:56 AM Scope Out: 11:36:44 AM Scope Withdrawal Time: 0 hours 13 minutes 22 seconds  Total Procedure Duration: 0 hours 18 minutes 48 seconds  Findings:                 The perianal and digital rectal examinations were                            normal. Pertinent negatives include normal                            sphincter tone and no palpable rectal lesions.                           Two sessile polyps were found in the descending                            colon and ascending colon. The polyps were 4 to 5                            mm in size. These polyps were removed with a cold  snare. Resection and retrieval were complete.                           Internal hemorrhoids were found during retroflexion. Complications:            No immediate complications. Estimated blood loss:                            Minimal. Estimated Blood Loss:     Estimated blood loss was minimal. Impression:               - Two 4 to 5 mm polyps in the descending colon and                            in the ascending colon, removed with a cold snare.                            Resected and retrieved.                           - Internal hemorrhoids. Recommendation:           - Discharge patient to home (ambulatory).                           - Await pathology results.                           - Repeat colonoscopy for surveillance based on                            pathology results.                           - The findings and recommendations  were discussed                            with the patient's family.                           - Return to referring physician.                           - Patient has a contact number available for                            emergencies. The signs and symptoms of potential                            delayed complications were discussed with the                            patient. Return to normal activities tomorrow.                            Written discharge instructions were provided to the  patient. Maren Beach, MD 10/23/2023 11:42:41 AM This report has been signed electronically.

## 2023-10-23 NOTE — Progress Notes (Signed)
 Vss nad trans to pacu

## 2023-10-23 NOTE — Patient Instructions (Signed)
 Resume previous diet Continue present medications Await pathology results Handouts/information given for polyps and hemorrhoids  YOU HAD AN ENDOSCOPIC PROCEDURE TODAY AT THE [facility]:   Refer to the procedure report that was given to you for any specific questions about what was found during the examination.  If the procedure report does not answer your questions, please call your gastroenterologist to clarify.  If you requested that your care partner not be given the details of your procedure findings, then the procedure report has been included in a sealed envelope for you to review at your convenience later.  YOU SHOULD EXPECT: Some feelings of bloating in the abdomen. Passage of more gas than usual.  Walking can help get rid of the air that was put into your GI tract during the procedure and reduce the bloating. If you had a lower endoscopy (such as a colonoscopy or flexible sigmoidoscopy) you may notice spotting of blood in your stool or on the toilet paper. If you underwent a bowel prep for your procedure, you may not have a normal bowel movement for a few days.  Please Note:  You might notice some irritation and congestion in your nose or some drainage.  This is from the oxygen used during your procedure.  There is no need for concern and it should clear up in a day or so.  SYMPTOMS TO REPORT IMMEDIATELY:  Following lower endoscopy (colonoscopy):  Excessive amounts of blood in the stool  Significant tenderness or worsening of abdominal pains  Swelling of the abdomen that is new, acute  Fever of 100F or higher  For urgent or emergent issues, a gastroenterologist can be reached at any hour by calling [phone]. Do not use MyChart messaging for urgent concerns.   DIET:  We do recommend a small meal at first, but then you may proceed to your regular diet.  Drink plenty of fluids but you should avoid alcoholic beverages for 24 hours.  ACTIVITY:  You should plan to take  it easy for the rest of today and you should NOT DRIVE or use heavy machinery until tomorrow (because of the sedation medicines used during the test).    FOLLOW UP: Our staff will call the number listed on your records the next business day following your procedure.  We will call around 7:15- 8:00 am to check on you and address any questions or concerns that you may have regarding the information given to you following your procedure. If we do not reach you, we will leave a message.     If any biopsies were taken you will be contacted by phone or by letter within the next 1-3 weeks.  Please call us at [phone] if you have not heard about the biopsies in 3 weeks.    SIGNATURES/CONFIDENTIALITY: You and/or your care partner have signed paperwork which will be entered into your electronic medical record.  These signatures attest to the fact that that the information above on your After Visit Summary has been reviewed and is understood.  Full responsibility of the confidentiality of this discharge information lies with you and/or your care-partner.

## 2023-10-26 ENCOUNTER — Telehealth: Payer: Self-pay

## 2023-10-26 NOTE — Telephone Encounter (Signed)
  Follow up Call-     10/23/2023   10:20 AM  Call back number  Post procedure Call Back phone  # [phone]  Permission to leave phone message Yes     Patient questions:  Do you have a fever, pain , or abdominal swelling? No. Pain Score  0 *  Have you tolerated food without any problems? Yes.    Have you been able to return to your normal activities? Yes.    Do you have any questions about your discharge instructions: Diet   No. Medications  No. Follow up visit  No.  Do you have questions or concerns about your Care? No.  Actions: * If pain score is 4 or above: No action needed, pain <4.

## 2023-10-27 ENCOUNTER — Encounter: Payer: Self-pay | Admitting: Pediatrics

## 2023-10-27 LAB — SURGICAL PATHOLOGY

## 2023-11-09 ENCOUNTER — Telehealth: Admitting: Emergency Medicine

## 2023-11-09 DIAGNOSIS — R062 Wheezing: Secondary | ICD-10-CM

## 2023-11-09 NOTE — Progress Notes (Signed)
  Because you have chest pain and wheezing, I feel your condition warrants further evaluation and I recommend that you be seen in a face-to-face visit, such as at an urgent care or with your primary care provider if you can be seen today.    NOTE: There will be NO CHARGE for this E-Visit   If you are having a true medical emergency, please call 911.     For an urgent face to face visit, [place] has multiple urgent care centers for your convenience.  Click the link below for the full list of locations and hours, walk-in wait times, appointment scheduling options and driving directions:  Urgent Care - [place], [place], [place], [place], [place], [place]  [place]     Your MyChart E-visit questionnaire answers were reviewed by a board certified advanced clinical practitioner to complete your personal care plan based on your specific symptoms.    Thank you for using e-Visits.

## 2024-01-28 ENCOUNTER — Other Ambulatory Visit: Payer: Self-pay | Admitting: Sports Medicine

## 2024-01-28 DIAGNOSIS — E782 Mixed hyperlipidemia: Secondary | ICD-10-CM

## 2024-04-12 ENCOUNTER — Encounter: Payer: Self-pay | Admitting: Sports Medicine
